# Patient Record
Sex: Male | Born: 1945 | Race: White | Hispanic: No | Marital: Married | Smoking: Never smoker
Health system: Southern US, Community
[De-identification: ages and names within clinical notes are randomized; demographics above are authoritative.]

---

## 2019-01-09 ENCOUNTER — Ambulatory Visit (HOSPITAL_COMMUNITY): Payer: Self-pay | Admitting: Psychiatry

## 2019-01-23 ENCOUNTER — Encounter (HOSPITAL_COMMUNITY): Payer: Self-pay

## 2019-01-23 ENCOUNTER — Emergency Department (HOSPITAL_COMMUNITY): Payer: Medicare HMO

## 2019-01-23 ENCOUNTER — Ambulatory Visit (HOSPITAL_COMMUNITY)
Admission: RE | Admit: 2019-01-23 | Discharge: 2019-01-23 | Disposition: A | Discharge status: Home / Self Care | Payer: Medicare HMO | Source: Home / Self Care | Attending: Psychiatry | Admitting: Psychiatry

## 2019-01-23 ENCOUNTER — Emergency Department (HOSPITAL_COMMUNITY)
Admission: EM | Admit: 2019-01-23 | Discharge: 2019-01-24 | Disposition: A | Discharge status: Other Acute Inpatient Hospital | Payer: Medicare HMO | Attending: Emergency Medicine | Admitting: Emergency Medicine

## 2019-01-23 ENCOUNTER — Other Ambulatory Visit: Payer: Self-pay

## 2019-01-23 DIAGNOSIS — R4587 Impulsiveness: Secondary | ICD-10-CM

## 2019-01-23 DIAGNOSIS — F322 Major depressive disorder, single episode, severe without psychotic features: Secondary | ICD-10-CM | POA: Diagnosis present

## 2019-01-23 DIAGNOSIS — R45851 Suicidal ideations: Secondary | ICD-10-CM | POA: Insufficient documentation

## 2019-01-23 DIAGNOSIS — F329 Major depressive disorder, single episode, unspecified: Secondary | ICD-10-CM

## 2019-01-23 DIAGNOSIS — Z79899 Other long term (current) drug therapy: Secondary | ICD-10-CM | POA: Insufficient documentation

## 2019-01-23 LAB — RAPID URINE DRUG SCREEN, HOSP PERFORMED
Amphetamines: NOT DETECTED
Barbiturates: NOT DETECTED
Benzodiazepines: NOT DETECTED
Cocaine: NOT DETECTED
OPIATES: NOT DETECTED
Tetrahydrocannabinol: NOT DETECTED

## 2019-01-23 LAB — URINALYSIS, ROUTINE W REFLEX MICROSCOPIC
Bilirubin Urine: NEGATIVE
Glucose, UA: NEGATIVE mg/dL
Hgb urine dipstick: NEGATIVE
Ketones, ur: NEGATIVE mg/dL
Leukocytes, UA: NEGATIVE
Nitrite: NEGATIVE
Protein, ur: NEGATIVE mg/dL
Specific Gravity, Urine: 1.004 — ABNORMAL LOW (ref 1.005–1.030)
pH: 8 (ref 5.0–8.0)

## 2019-01-23 LAB — COMPREHENSIVE METABOLIC PANEL
ALK PHOS: 79 U/L (ref 38–126)
ALT: 25 U/L (ref 0–44)
AST: 21 U/L (ref 15–41)
Albumin: 4.3 g/dL (ref 3.5–5.0)
Anion gap: 9 (ref 5–15)
BILIRUBIN TOTAL: 1.3 mg/dL — AB (ref 0.3–1.2)
BUN: 12 mg/dL (ref 8–23)
CALCIUM: 9.3 mg/dL (ref 8.9–10.3)
CO2: 28 mmol/L (ref 22–32)
Chloride: 93 mmol/L — ABNORMAL LOW (ref 98–111)
Creatinine, Ser: 0.76 mg/dL (ref 0.61–1.24)
GFR calc Af Amer: >60 mL/min (ref >60)
GFR calc non Af Amer: >60 mL/min (ref >60)
Glucose, Bld: 99 mg/dL (ref 70–99)
Potassium: 3.5 mmol/L (ref 3.5–5.1)
Sodium: 130 mmol/L — ABNORMAL LOW (ref 135–145)
TOTAL PROTEIN: 7.1 g/dL (ref 6.5–8.1)

## 2019-01-23 LAB — CBC
HCT: 48.2 % (ref 39.0–52.0)
Hemoglobin: 16.5 g/dL (ref 13.0–17.0)
MCH: 32.4 pg (ref 26.0–34.0)
MCHC: 34.2 g/dL (ref 30.0–36.0)
MCV: 94.5 fL (ref 80.0–100.0)
Platelets: 239 10*3/uL (ref 150–400)
RBC: 5.1 MIL/uL (ref 4.22–5.81)
RDW: 13.3 % (ref 11.5–15.5)
WBC: 8.4 10*3/uL (ref 4.0–10.5)
nRBC: 0 % (ref 0.0–0.2)

## 2019-01-23 LAB — ETHANOL: Alcohol, Ethyl (B): <10 mg/dL (ref <10)

## 2019-01-23 LAB — ACETAMINOPHEN LEVEL: Acetaminophen (Tylenol), Serum: <10 ug/mL — ABNORMAL LOW (ref 10–30)

## 2019-01-23 LAB — SALICYLATE LEVEL: Salicylate Lvl: <7 mg/dL (ref 2.8–30.0)

## 2019-01-23 MED ORDER — DULOXETINE HCL 30 MG PO CPEP
60.0000 mg | ORAL_CAPSULE | Freq: Every day | ORAL | Status: DC
  Administered 2019-01-24: 60 mg via ORAL
  Filled 2019-01-23: qty 2

## 2019-01-23 MED ORDER — TAMSULOSIN HCL 0.4 MG PO CAPS
0.4000 mg | ORAL_CAPSULE | Freq: Every day | ORAL | Status: DC
  Administered 2019-01-23 – 2019-01-24 (×2): 0.4 mg via ORAL
  Filled 2019-01-23 (×2): qty 1

## 2019-01-23 MED ORDER — ZOLPIDEM TARTRATE 10 MG PO TABS
10.0000 mg | ORAL_TABLET | Freq: Every evening | ORAL | Status: DC | PRN
  Administered 2019-01-23: 10 mg via ORAL
  Filled 2019-01-23: qty 1

## 2019-01-23 MED ORDER — HYDROCHLOROTHIAZIDE 12.5 MG PO CAPS
12.5000 mg | ORAL_CAPSULE | Freq: Every day | ORAL | Status: DC
  Administered 2019-01-24: 12.5 mg via ORAL
  Filled 2019-01-23: qty 1

## 2019-01-23 MED ORDER — PANTOPRAZOLE SODIUM 40 MG PO TBEC
40.0000 mg | DELAYED_RELEASE_TABLET | Freq: Every day | ORAL | Status: DC
  Administered 2019-01-23 – 2019-01-24 (×2): 40 mg via ORAL
  Filled 2019-01-23 (×2): qty 1

## 2019-01-23 MED ORDER — GABAPENTIN 300 MG PO CAPS
300.0000 mg | ORAL_CAPSULE | Freq: Three times a day (TID) | ORAL | Status: DC
  Administered 2019-01-23 – 2019-01-24 (×3): 300 mg via ORAL
  Filled 2019-01-23 (×3): qty 1

## 2019-01-23 MED ORDER — TIMOLOL MALEATE 0.5 % OP SOLN
1.0000 [drp] | Freq: Two times a day (BID) | OPHTHALMIC | Status: DC
  Administered 2019-01-23 – 2019-01-24 (×2): 1 [drp] via OPHTHALMIC
  Filled 2019-01-23: qty 5

## 2019-01-23 MED ORDER — LORAZEPAM 1 MG PO TABS
1.0000 mg | ORAL_TABLET | Freq: Once | ORAL | Status: AC
  Administered 2019-01-23: 1 mg via ORAL
  Filled 2019-01-23: qty 1

## 2019-01-23 MED ORDER — CLONAZEPAM 0.5 MG PO TABS
0.5000 mg | ORAL_TABLET | Freq: Every day | ORAL | Status: DC
  Administered 2019-01-23: 0.5 mg via ORAL
  Filled 2019-01-23: qty 1

## 2019-01-23 NOTE — ED Triage Notes (Signed)
Pt reports that he has been depressed and having thoughts of killing himself. Pt [place]. 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

## 2019-01-23 NOTE — ED Notes (Signed)
Bed: [facility]PT3 Expected date:  Expected time:  Means of arrival:  Comments: 

## 2019-01-23 NOTE — ED Notes (Signed)
Pt to room #40. Pt presents with sad affect, Pt reports increase in depression. Identifies primary stressor as developing tinnitus.  Expressing hopelessness, anhedonia , and difficulty sleeping. Identifies support system as wife. Encouragement and support provided. Special checks q 15 mins in place for safety, Video monitoring in place. Will continue to monitor.

## 2019-01-23 NOTE — ED Provider Notes (Addendum)
[facility] DEPT Provider Note   CSN: [id number] Arrival date & time: 01/23/19  1415     History   Chief Complaint Chief Complaint  Patient presents with  . Suicidal    HPI Ruben Duncan is a 73 y.o. male.  HPI Patient presents with depression and suicidal thoughts.  States he is been dealing with depression for years but has been worse recently.  States he got worse after diagnosed with tinnitus.  States that he was recently inpatient for 5 days up at [place].  Went to behavioral health today.  They had recommended inpatient treatment.  Denies substance abuse.  States he is on Cymbalta for the last month and states it is not been helping.  Plan to cut his wrists.  No attempt currently.  No hallucinations.  Patient states he just wants to get back to enjoying life.  Denies substance abuse. History reviewed. No pertinent past medical history.  There are no active problems to display for this patient.   History reviewed. No pertinent surgical history.      Home Medications    Prior to Admission medications   Medication Sig Start Date End Date Taking? Authorizing Provider  B Complex-C (B-COMPLEX WITH VITAMIN C) tablet Take 1 tablet by mouth daily.   Yes [provider]  Cholecalciferol (VITAMIN D3) 25 MCG (1000 UT) CAPS Take 1,000 mg by mouth daily.   Yes [provider]  clonazePAM (KLONOPIN) 0.5 MG tablet Take 0.5 mg by mouth at bedtime.  01/05/19  Yes [provider]  DULoxetine (CYMBALTA) 60 MG capsule Take 60 mg by mouth daily. 12/26/18  Yes [provider]  gabapentin (NEURONTIN) 300 MG capsule Take 300 mg by mouth 3 (three) times daily. 01/03/19  Yes [provider]  hydrochlorothiazide (MICROZIDE) 12.5 MG capsule Take 12.5 mg by mouth daily. 01/06/19  Yes [provider]  omeprazole (PRILOSEC) 20 MG capsule Take 20 mg by mouth daily.   Yes [provider]  tamsulosin (FLOMAX) 0.4  MG CAPS capsule Take 0.4 mg by mouth daily. 01/15/19  Yes [provider]  timolol (TIMOPTIC) 0.5 % ophthalmic solution Place 1 drop into both eyes 2 (two) times daily. 12/27/18  Yes [provider]  zolpidem (AMBIEN) 10 MG tablet Take 10 mg by mouth at bedtime as needed.  01/03/19  Yes [provider]    Family History History reviewed. No pertinent family history.  Social History Social History   Tobacco Use  . Smoking status: Not on file  Substance Use Topics  . Alcohol use: Not on file  . Drug use: Not on file     Allergies   Citalopram   Review of Systems Review of Systems  Constitutional: Positive for appetite change. Negative for unexpected weight change.  HENT: Negative for congestion.   Respiratory: Negative for shortness of breath.   Cardiovascular: Negative for chest pain.  Gastrointestinal: Negative for abdominal distention.  Genitourinary: Negative for flank pain.  Musculoskeletal: Negative for back pain.  Skin: Negative for rash.  Neurological: Negative for weakness.  Psychiatric/Behavioral: Positive for dysphoric mood and suicidal ideas. Negative for hallucinations. The patient is not hyperactive.      Physical Exam Updated Vital Signs BP (!) 159/106 (BP Location: Left Arm)   Pulse 62   Temp 97.9 F (36.6 C)   Resp 18   SpO2 99%   Physical Exam HENT:     Head: Atraumatic.     Mouth/Throat:  Mouth: Mucous membranes are moist.  Eyes:     Extraocular Movements: Extraocular movements intact.  Neck:     Musculoskeletal: Neck supple.  Cardiovascular:     Rate and Rhythm: Normal rate and regular rhythm.     Heart sounds: Normal heart sounds.  Pulmonary:     Effort: Pulmonary effort is normal.  Abdominal:     Tenderness: There is no abdominal tenderness.  Musculoskeletal:     Right lower leg: No edema.     Left lower leg: No edema.  Skin:    General: Skin is warm.     Capillary Refill: Capillary refill takes less  than 2 seconds.  Neurological:     General: No focal deficit present.     Mental Status: He is alert.  Psychiatric:     Comments: Patient has a depressed affect      ED Treatments / Results  Labs (all labs ordered are listed, but only abnormal results are displayed) Labs Reviewed  COMPREHENSIVE METABOLIC PANEL - Abnormal; Notable for the following components:      Result Value   Sodium 130 (*)    Chloride 93 (*)    Total Bilirubin 1.3 (*)    All other components within normal limits  ACETAMINOPHEN LEVEL - Abnormal; Notable for the following components:   Acetaminophen (Tylenol), Serum <10 (*)    All other components within normal limits  URINALYSIS, ROUTINE W REFLEX MICROSCOPIC - Abnormal; Notable for the following components:   Color, Urine STRAW (*)    Specific Gravity, Urine 1.004 (*)    All other components within normal limits  ETHANOL  SALICYLATE LEVEL  CBC  RAPID URINE DRUG SCREEN, HOSP PERFORMED    EKG None  Radiology Dg Chest 2 View  Result Date: 01/23/2019 CLINICAL DATA:  Depression EXAM: CHEST - 2 VIEW COMPARISON:  None. FINDINGS: Heart size is normal. Mediastinal shadows are normal. The lungs are clear. The vascularity is normal. No effusions. Ordinary degenerative changes affect the spine. IMPRESSION: No active cardiopulmonary disease. Electronically Signed   By: Nelson Chimes M.D.   On: 01/23/2019 16:04    Procedures Procedures (including critical care time)  Medications Ordered in ED Medications  clonazePAM (KLONOPIN) tablet 0.5 mg (has no administration in time range)  DULoxetine (CYMBALTA) DR capsule 60 mg (has no administration in time range)  gabapentin (NEURONTIN) capsule 300 mg (has no administration in time range)  hydrochlorothiazide (MICROZIDE) capsule 12.5 mg (has no administration in time range)  tamsulosin (FLOMAX) capsule 0.4 mg (has no administration in time range)  pantoprazole (PROTONIX) EC tablet 40 mg (has no administration in time  range)  timolol (TIMOPTIC) 0.5 % ophthalmic solution 1 drop (has no administration in time range)  zolpidem (AMBIEN) tablet 10 mg (has no administration in time range)  LORazepam (ATIVAN) tablet 1 mg (1 mg Oral Given 01/23/19 1740)     Initial Impression / Assessment and Plan / ED Course  I have reviewed the triage vital signs and the nursing notes.  Pertinent labs & imaging results that were available during my care of the patient were reviewed by me and considered in my medical decision making (see chart for details).     Patient presents with depression and suicidal ideation.  Somewhat based on his tenderness.  Has mild hyponatremia that will need to be followed.  Medically cleared.  Final Clinical Impressions(s) / ED Diagnoses   Final diagnoses:  Suicidal ideation    ED Discharge Orders    None  Davonna Belling, MD 01/23/19 1904  Accepted [facility].  Dr. Leonette [facility].  Reevaluated before transfer.    Davonna Belling, MD 01/24/19 1550

## 2019-01-23 NOTE — ED Notes (Signed)
Pt c/o anxiety MD made aware. Awaiting orders.

## 2019-01-23 NOTE — H&P (Signed)
Behavioral Health Medical Screening Exam  Ruben Duncan is an 73 y.o. male patient presents to [facility] as a walk in with complaints of suicidal ideation and plan to cut his wrist.  Patient is unable to contract for safety  Total Time spent with patient: 30 minutes  Psychiatric Specialty Exam: Physical Exam  Constitutional: He is oriented to person, place, and time. He appears well-developed and well-nourished.  Neck: Normal range of motion. Neck supple.  Respiratory: Effort normal.  Musculoskeletal: Normal range of motion.  Neurological: He is alert and oriented to person, place, and time.  Skin: Skin is warm and dry.  Psychiatric: His speech is normal and behavior is normal. Cognition and memory are normal. He expresses impulsivity. He exhibits a depressed mood. He expresses suicidal ideation. He expresses suicidal plans.    ROS  Blood pressure (!) 150/102, pulse (!) 53, temperature 98.5 F (36.9 C), resp. rate 18, SpO2 100 %.There is no height or weight on file to calculate BMI.  General Appearance: Casual  Eye Contact:  Minimal  Speech:  Clear and Coherent and Normal Rate  Volume:  Normal  Mood:  Depressed and Hopeless  Affect:  Depressed and Flat  Thought Process:  Coherent and Goal Directed  Orientation:  Full (Time, Place, and Person)  Thought Content:  Logical  Suicidal Thoughts:  Yes.  with intent/plan  Homicidal Thoughts:  No  Memory:  Immediate;   Good Recent;   Good Remote;   Good  Judgement:  Impaired  Insight:  Lacking  Psychomotor Activity:  Decreased  Concentration: Concentration: Fair and Attention Span: Fair  Recall:  Good  Fund of Knowledge:Good  Language: Good  Akathisia:  No  Handed:  Right  AIMS (if indicated):     Assets:  Communication Skills Desire for Improvement Financial Resources/Insurance Housing Resilience Social Support Transportation  Sleep:       Musculoskeletal: Strength & Muscle Tone: within normal limits Gait & Station:  normal Patient leans: N/A  Blood pressure (!) 150/102, pulse (!) 53, temperature 98.5 F (36.9 C), resp. rate 18, SpO2 100 %.  Recommendations:Inpatient psychiatric treatment  Based on my evaluation the patient does not appear to have an emergency medical condition.  Desmin Daleo, NP 01/23/2019, 1:36 PM

## 2019-01-23 NOTE — ED Notes (Signed)
Pleasant, quiet, flat affect. States he is able to contract for safety at this time but does endorse feeling anxious. He denies any HI or psychotic sx.His bed was moved by staff to the opposite wall because he doesn't believe he can sleep with so much light and moving the bed made it slightly darker as he is now not directly under the light. He did call his wife earlier this shift and had a brief conversation, when Probation officer commented on its brevity he said after 50 years there is not much to say. No complaints voiced. Will continue to monitor for safety.

## 2019-01-23 NOTE — BH Assessment (Signed)
Assessment Note  Ruben Duncan is an 73 y.o. male. Pt reports SI with a plan to cut his wrists. Pt denies HI/AVH. Pt states he has been diagnosed with Tinnitus. The Pt states since his diagnosis he feels hopeless and has been thinking of "ending it." Pt denies previous SI attempts. Pt states he has been diagnosed with depression all of his life but it has worsened. The Pt has been seen by Dr. Reece Levy. The Pt is currently prescribed Cymbalta and Neurontin. The Pt reports previous inpatient treatment in January 2020.   Shuvon, NP recommends inpatient and medical clearance.   Diagnosis:  F33.2 MDD  Past Medical History: No past medical history on file.   Family History: No family history on file.  Social History:  has no history on file for tobacco, alcohol, and drug.  Additional Social History:  Alcohol / Drug Use Pain Medications: please see mar Prescriptions: please see mar Over the Counter: please see mar History of alcohol / drug use?: No history of alcohol / drug abuse Longest period of sobriety (when/how long): NA  CIWA: CIWA-Ar BP: (!) 150/102 Pulse Rate: (!) 53 COWS:    Allergies: Allergies not on file  Home Medications: (Not in a hospital admission)   OB/GYN Status:  No LMP for male patient.  General Assessment Data Location of Assessment: [facility] Assessment Services TTS Assessment: In system Is this a Tele or Face-to-Face Assessment?: Face-to-Face Is this an Initial Assessment or a Re-assessment for this encounter?: Initial Assessment Patient Accompanied by:: N/A Language Other than English: No Living Arrangements: Other (Comment) What gender do you identify as?: Male Marital status: Single Maiden name: NA Pregnancy Status: No Living Arrangements: Spouse/significant other Can pt return to current living arrangement?: Yes Admission Status: Voluntary Is patient capable of signing voluntary admission?: Yes Referral Source: Self/Family/Friend Insurance type:  Materials engineer Exam ([place]) Medical Exam completed: Yes  Crisis Care Plan Living Arrangements: Spouse/significant other Legal Guardian: Other:(self) Name of Psychiatrist: NA Name of Therapist: NA  Education Status Is patient currently in school?: No Is the patient employed, unemployed or receiving disability?: Unemployed  Risk to self with the past 6 months Suicidal Ideation: Yes-Currently Present Has patient been a risk to self within the past 6 months prior to admission? : No Suicidal Intent: Yes-Currently Present Has patient had any suicidal intent within the past 6 months prior to admission? : No Is patient at risk for suicide?: Yes Suicidal Plan?: Yes-Currently Present Has patient had any suicidal plan within the past 6 months prior to admission? : No Specify Current Suicidal Plan: to cut his wrists Access to Means: Yes Specify Access to Suicidal Means: access to knives What has been your use of drugs/alcohol within the last 12 months?: NA Previous Attempts/Gestures: No How many times?: 0 Other Self Harm Risks: NA Triggers for Past Attempts: None known Intentional Self Injurious Behavior: None Family Suicide History: No Recent stressful life event(s): Other (Comment)(diagnosis) Persecutory voices/beliefs?: No Depression: Yes Depression Symptoms: Tearfulness, Isolating, Fatigue, Loss of interest in usual pleasures, Feeling worthless/self pity, Feeling angry/irritable Substance abuse history and/or treatment for substance abuse?: No Suicide prevention information given to non-admitted patients: Not applicable  Risk to Others within the past 6 months Homicidal Ideation: No Does patient have any lifetime risk of violence toward others beyond the six months prior to admission? : No Thoughts of Harm to Others: No Current Homicidal Intent: No Current Homicidal Plan: No Access to Homicidal Means: No Identified Victim: NA History  of harm to others?:  No Assessment of Violence: None Noted Violent Behavior Description: NA Does patient have access to weapons?: No Criminal Charges Pending?: No Does patient have a court date: No Is patient on probation?: No  Psychosis Hallucinations: None noted Delusions: None noted  Mental Status Report Appearance/Hygiene: Unremarkable Eye Contact: Fair Motor Activity: Freedom of movement Speech: Logical/coherent Level of Consciousness: Alert Mood: Depressed Affect: Depressed Anxiety Level: Minimal Thought Processes: Coherent, Relevant Judgement: Impaired Orientation: Person, Place, Time, Situation Obsessive Compulsive Thoughts/Behaviors: None  Cognitive Functioning Concentration: Normal Memory: Recent Intact, Remote Intact Is patient IDD: No Insight: Poor Impulse Control: Poor Appetite: Fair Have you had any weight changes? : No Change Sleep: No Change Total Hours of Sleep: 8 Vegetative Symptoms: None  ADLScreening [facility] Assessment Services) Patient's cognitive ability adequate to safely complete daily activities?: Yes Patient able to express need for assistance with ADLs?: Yes Independently performs ADLs?: Yes (appropriate for developmental age)  Prior Inpatient Therapy Prior Inpatient Therapy: Yes Prior Therapy Dates: Jan 2020 Prior Therapy Facilty/Provider(s): [facility] Reason for Treatment: depression  Prior Outpatient Therapy Prior Outpatient Therapy: Yes Prior Therapy Dates: current Prior Therapy Facilty/Provider(s): Dr. Reece Levy Reason for Treatment: depression Does patient have an ACCT team?: No Does patient have Intensive In-House Services?  : No Does patient have [facility] services? : No Does patient have P4CC services?: No  ADL Screening (condition at time of admission) Patient's cognitive ability adequate to safely complete daily activities?: Yes Is the patient deaf or have difficulty hearing?: No Does the patient have difficulty seeing, even when wearing  glasses/contacts?: No Does the patient have difficulty concentrating, remembering, or making decisions?: No Patient able to express need for assistance with ADLs?: Yes Does the patient have difficulty dressing or bathing?: No Independently performs ADLs?: Yes (appropriate for developmental age)       Abuse/Neglect Assessment (Assessment to be complete while patient is alone) Abuse/Neglect Assessment Can Be Completed: Yes Physical Abuse: Denies Verbal Abuse: Denies Sexual Abuse: Denies Exploitation of patient/patient's resources: Denies     Regulatory affairs officer (For Healthcare) Does Patient Have a Medical Advance Directive?: No Would patient like information on creating a medical advance directive?: No - Patient declined          Disposition:  Disposition Initial Assessment Completed for this Encounter: Yes Disposition of Patient: Admit Type of inpatient treatment program: Adult  On Site Evaluation by:   Reviewed with Physician:    Cyndia Bent 01/23/2019 2:07 PM

## 2019-01-23 NOTE — BHH Counselor (Signed)
Pt was assessed as a walk-in a [facility]. Shuvon, NP recommends inpatient treatment. TTS to seek placement. Updated disposition discussed with Dr. Alvino Chapel and Collie Siad, RN.    Vertell Novak, MS, [facility], [facility] Med Triage Specialist [phone]

## 2019-01-23 NOTE — ED Notes (Signed)
Specimen cup provided, pt encouraged to provide urine per MD order.

## 2019-01-24 ENCOUNTER — Other Ambulatory Visit: Payer: Self-pay

## 2019-01-24 ENCOUNTER — Other Ambulatory Visit: Payer: Self-pay | Admitting: Psychiatry

## 2019-01-24 ENCOUNTER — Inpatient Hospital Stay
Admission: AD | Admit: 2019-01-24 | Discharge: 2019-01-29 | DRG: 885 | Disposition: A | Discharge status: Home / Self Care | Payer: Medicare HMO | Source: Intra-hospital | Attending: Psychiatry | Admitting: Psychiatry

## 2019-01-24 DIAGNOSIS — G4700 Insomnia, unspecified: Secondary | ICD-10-CM | POA: Diagnosis present

## 2019-01-24 DIAGNOSIS — N400 Enlarged prostate without lower urinary tract symptoms: Secondary | ICD-10-CM | POA: Diagnosis present

## 2019-01-24 DIAGNOSIS — F1021 Alcohol dependence, in remission: Secondary | ICD-10-CM | POA: Diagnosis present

## 2019-01-24 DIAGNOSIS — H409 Unspecified glaucoma: Secondary | ICD-10-CM | POA: Diagnosis present

## 2019-01-24 DIAGNOSIS — K219 Gastro-esophageal reflux disease without esophagitis: Secondary | ICD-10-CM | POA: Diagnosis present

## 2019-01-24 DIAGNOSIS — F332 Major depressive disorder, recurrent severe without psychotic features: Principal | ICD-10-CM | POA: Diagnosis present

## 2019-01-24 DIAGNOSIS — F411 Generalized anxiety disorder: Secondary | ICD-10-CM | POA: Diagnosis present

## 2019-01-24 DIAGNOSIS — I10 Essential (primary) hypertension: Secondary | ICD-10-CM | POA: Diagnosis present

## 2019-01-24 DIAGNOSIS — D3610 Benign neoplasm of peripheral nerves and autonomic nervous system, unspecified: Secondary | ICD-10-CM | POA: Diagnosis present

## 2019-01-24 DIAGNOSIS — Z79899 Other long term (current) drug therapy: Secondary | ICD-10-CM

## 2019-01-24 DIAGNOSIS — R45851 Suicidal ideations: Secondary | ICD-10-CM | POA: Diagnosis present

## 2019-01-24 DIAGNOSIS — H9319 Tinnitus, unspecified ear: Secondary | ICD-10-CM

## 2019-01-24 DIAGNOSIS — H9311 Tinnitus, right ear: Secondary | ICD-10-CM | POA: Diagnosis present

## 2019-01-24 DIAGNOSIS — Z888 Allergy status to other drugs, medicaments and biological substances status: Secondary | ICD-10-CM

## 2019-01-24 DIAGNOSIS — F410 Panic disorder [episodic paroxysmal anxiety] without agoraphobia: Secondary | ICD-10-CM | POA: Diagnosis present

## 2019-01-24 DIAGNOSIS — F322 Major depressive disorder, single episode, severe without psychotic features: Secondary | ICD-10-CM | POA: Diagnosis present

## 2019-01-24 DIAGNOSIS — F329 Major depressive disorder, single episode, unspecified: Secondary | ICD-10-CM

## 2019-01-24 DIAGNOSIS — H9313 Tinnitus, bilateral: Secondary | ICD-10-CM | POA: Diagnosis not present

## 2019-01-24 MED ORDER — TIMOLOL MALEATE 0.5 % OP SOLN
1.0000 [drp] | Freq: Two times a day (BID) | OPHTHALMIC | Status: DC
  Administered 2019-01-24 – 2019-01-29 (×10): 1 [drp] via OPHTHALMIC
  Filled 2019-01-24: qty 5

## 2019-01-24 MED ORDER — ACETAMINOPHEN 325 MG PO TABS
650.0000 mg | ORAL_TABLET | Freq: Four times a day (QID) | ORAL | Status: DC | PRN
  Administered 2019-01-26: 650 mg via ORAL
  Filled 2019-01-24: qty 2

## 2019-01-24 MED ORDER — CLONAZEPAM 0.5 MG PO TABS
0.5000 mg | ORAL_TABLET | Freq: Every day | ORAL | Status: DC
  Administered 2019-01-24: 0.5 mg via ORAL
  Filled 2019-01-24: qty 1

## 2019-01-24 MED ORDER — GABAPENTIN 300 MG PO CAPS
300.0000 mg | ORAL_CAPSULE | Freq: Three times a day (TID) | ORAL | Status: DC
  Administered 2019-01-24 – 2019-01-29 (×15): 300 mg via ORAL
  Filled 2019-01-24 (×16): qty 1

## 2019-01-24 MED ORDER — HYDROXYZINE HCL 25 MG PO TABS
25.0000 mg | ORAL_TABLET | Freq: Three times a day (TID) | ORAL | Status: DC
  Administered 2019-01-24 – 2019-01-25 (×4): 25 mg via ORAL
  Filled 2019-01-24 (×5): qty 1

## 2019-01-24 MED ORDER — DULOXETINE HCL 30 MG PO CPEP
60.0000 mg | ORAL_CAPSULE | Freq: Every day | ORAL | Status: DC
  Administered 2019-01-25 – 2019-01-29 (×5): 60 mg via ORAL
  Filled 2019-01-24 (×5): qty 2

## 2019-01-24 MED ORDER — HYDROXYZINE HCL 25 MG PO TABS
25.0000 mg | ORAL_TABLET | Freq: Three times a day (TID) | ORAL | Status: DC
  Administered 2019-01-24 (×2): 25 mg via ORAL
  Filled 2019-01-24 (×2): qty 1

## 2019-01-24 MED ORDER — PANTOPRAZOLE SODIUM 40 MG PO TBEC
40.0000 mg | DELAYED_RELEASE_TABLET | Freq: Every day | ORAL | Status: DC
  Administered 2019-01-25 – 2019-01-29 (×5): 40 mg via ORAL
  Filled 2019-01-24 (×5): qty 1

## 2019-01-24 MED ORDER — MAGNESIUM HYDROXIDE 400 MG/5ML PO SUSP: 30.0000 mL | Freq: Every day | ORAL | Status: DC | PRN

## 2019-01-24 MED ORDER — HYDROCHLOROTHIAZIDE 12.5 MG PO CAPS
12.5000 mg | ORAL_CAPSULE | Freq: Every day | ORAL | Status: DC
  Administered 2019-01-25 – 2019-01-29 (×5): 12.5 mg via ORAL
  Filled 2019-01-24 (×5): qty 1

## 2019-01-24 MED ORDER — ZOLPIDEM TARTRATE 5 MG PO TABS
5.0000 mg | ORAL_TABLET | Freq: Every evening | ORAL | Status: DC | PRN
  Administered 2019-01-24: 5 mg via ORAL
  Filled 2019-01-24: qty 1

## 2019-01-24 MED ORDER — TAMSULOSIN HCL 0.4 MG PO CAPS
0.4000 mg | ORAL_CAPSULE | Freq: Every day | ORAL | Status: DC
  Administered 2019-01-25 – 2019-01-29 (×5): 0.4 mg via ORAL
  Filled 2019-01-24 (×5): qty 1

## 2019-01-24 MED ORDER — HYDROXYZINE HCL 25 MG PO TABS
50.0000 mg | ORAL_TABLET | Freq: Once | ORAL | Status: AC
  Administered 2019-01-24: 50 mg via ORAL
  Filled 2019-01-24: qty 2

## 2019-01-24 MED ORDER — ALUM & MAG HYDROXIDE-SIMETH 200-200-20 MG/5ML PO SUSP: 30.0000 mL | ORAL | Status: DC | PRN

## 2019-01-24 NOTE — Tx Team (Signed)
Initial Treatment Plan 01/24/2019 6:34 PM Ruben Duncan EHO:[id number]    PATIENT STRESSORS: Health problems Traumatic event   PATIENT STRENGTHS: Ability for insight Average or above average intelligence Communication skills Financial means Supportive family/friends   PATIENT IDENTIFIED PROBLEMS: Depression 01/24/19  Suicidal ideations 01/24/19                   DISCHARGE CRITERIA:  Ability to meet basic life and health needs Medical problems require only outpatient monitoring Verbal commitment to aftercare and medication compliance  PRELIMINARY DISCHARGE PLAN: Attend aftercare/continuing care group Return to previous living arrangement  PATIENT/FAMILY INVOLVEMENT: This treatment plan has been presented to and reviewed with the patient, Ruben Duncan, and/or family member, .  The patient and family have been given the opportunity to ask questions and make suggestions.  Merlene Morse, RN 01/24/2019, 6:34 PM

## 2019-01-24 NOTE — Consult Note (Signed)
[facility] Consult   Reason for Consult:  Suicidal Ideation Referring Physician:  EDP Patient Identification: Ruben Duncan MRN:  [id number] Principal Diagnosis: Major depressive disorder, single episode, severe without psychotic features ([place]) Diagnosis:  Principal Problem:   Major depressive disorder, single episode, severe without psychotic features ([place]) Active Problems:   Suicidal ideation   Total Time spent with patient: 30 minutes  Subjective:   Ruben Duncan is a 73 y.o. male patient admitted with suicidal ideation due to depression.  HPI:  Pt was seen and chart reviewed with treatment team and Dr Dwyane Dee. Pt lives with his wife in [place], [place] and was recently hospitalized for five days there for his depression. Pt stated his depression intensified after he was diagnosed with tinnitus. He stated he went to an ENT and they said there is nothing they can do to correct tinnutis. Pt appears sad and has a flat affect. Pt is unable to contract for safety outside the hospital. Pt's UDS/BAL are negative. He has been on Cymbalta for the past month but feels it is not helping. Pt has a plan to cut his wrists but has not attempted this. He stated he has no enjoyment in his life and just wants to get his mental health right so he can get back to living. Pt's home medications were restarted. Pt is calm and cooperative, alert & oriented x 4 and is able to take of his ADL's. He does not use an assistive device to walk. Pt is pleasant on approach. He has no pertinent medical history. Pt is recommended for an inpatient psychiatric admission for stabilization and medication management.   Past Psychiatric History: As above  Risk to Self:  Yes Risk to Others:  No Prior Inpatient Therapy:  Yes, in [place] Prior Outpatient Therapy:    Past Medical History: History reviewed. No pertinent past medical history. History reviewed. No pertinent surgical history. Family History: History  reviewed. No pertinent family history. Family Psychiatric  History: Pt did not give this information Social History:  Social History   Substance and Sexual Activity  Alcohol Use Not on file     Social History   Substance and Sexual Activity  Drug Use Not on file    Social History   Socioeconomic History  . Marital status: Married    Spouse name: Not on file  . Number of children: Not on file  . Years of education: Not on file  . Highest education level: Not on file  Occupational History  . Not on file  Social Needs  . Financial resource strain: Not on file  . Food insecurity:    Worry: Not on file    Inability: Not on file  . Transportation needs:    Medical: Not on file    Non-medical: Not on file  Tobacco Use  . Smoking status: Not on file  Substance and Sexual Activity  . Alcohol use: Not on file  . Drug use: Not on file  . Sexual activity: Not on file  Lifestyle  . Physical activity:    Days per week: Not on file    Minutes per session: Not on file  . Stress: Not on file  Relationships  . Social connections:    Talks on phone: Not on file    Gets together: Not on file    Attends religious service: Not on file    Active member of club or organization: Not on file    Attends meetings of clubs or  organizations: Not on file    Relationship status: Not on file  Other Topics Concern  . Not on file  Social History Narrative  . Not on file   Additional Social History:    Allergies:   Allergies  Allergen Reactions  . Citalopram     Ringing in the ears and anxiety    Labs:  Results for orders placed or performed during the hospital encounter of 01/23/19 (from the past 48 hour(s))  Comprehensive metabolic panel     Status: Abnormal   Collection Time: 01/23/19  3:08 PM  Result Value Ref Range   Sodium 130 (L) 135 - 145 mmol/L   Potassium 3.5 3.5 - 5.1 mmol/L   Chloride 93 (L) 98 - 111 mmol/L   CO2 28 22 - 32 mmol/L   Glucose, Bld 99 70 - 99 mg/dL    BUN 12 8 - 23 mg/dL   Creatinine, Ser 0.76 0.61 - 1.24 mg/dL   Calcium 9.3 8.9 - 10.3 mg/dL   Total Protein 7.1 6.5 - 8.1 g/dL   Albumin 4.3 3.5 - 5.0 g/dL   AST 21 15 - 41 U/L   ALT 25 0 - 44 U/L   Alkaline Phosphatase 79 38 - 126 U/L   Total Bilirubin 1.3 (H) 0.3 - 1.2 mg/dL   GFR calc non Af Amer >60 >60 mL/min   GFR calc Af Amer >60 >60 mL/min   Anion gap 9 5 - 15    Comment: Performed at [facility], [place] [address] [place], [place] [address]  Ethanol     Status: None   Collection Time: 01/23/19  3:08 PM  Result Value Ref Range   Alcohol, Ethyl (B) <10 <10 mg/dL    Comment: (NOTE) Lowest detectable limit for serum alcohol is 10 mg/dL. For medical purposes only. Performed at [facility], [place] [address], [place], [place] [address]   Salicylate level     Status: None   Collection Time: 01/23/19  3:08 PM  Result Value Ref Range   Salicylate Lvl <2.9 2.8 - 30.0 mg/dL    Comment: Performed at [facility], [place] [address], [place], [place] [address]  Acetaminophen level     Status: Abnormal   Collection Time: 01/23/19  3:08 PM  Result Value Ref Range   Acetaminophen (Tylenol), Serum <10 (L) 10 - 30 ug/mL    Comment: (NOTE) Therapeutic concentrations vary significantly. A range of 10-30 ug/mL  may be an effective concentration for many patients. However, some  are best treated at concentrations outside of this range. Acetaminophen concentrations >150 ug/mL at 4 hours after ingestion  and >50 ug/mL at 12 hours after ingestion are often associated with  toxic reactions. Performed at [facility], [place] [address] [place], [place] [address]   cbc     Status: None   Collection Time: 01/23/19  3:08 PM  Result Value Ref Range   WBC 8.4 4.0 - 10.5 K/uL   RBC 5.10 4.22 - 5.81 MIL/uL   Hemoglobin 16.5 13.0 - 17.0 g/dL   HCT 48.2 39.0 - 52.0 %   MCV 94.5 80.0 - 100.0 fL   MCH 32.4 26.0 - 34.0 pg   MCHC  34.2 30.0 - 36.0 g/dL   RDW 13.3 11.5 - 15.5 %   Platelets 239 150 - 400 K/uL   nRBC 0.0 0.0 - 0.2 %    Comment: Performed at [facility], [place] [address]  [place], [place] [address]  Rapid urine drug screen (hospital performed)     Status: None   Collection Time: 01/23/19  5:23 PM  Result Value Ref Range   Opiates NONE DETECTED NONE DETECTED   Cocaine NONE DETECTED NONE DETECTED   Benzodiazepines NONE DETECTED NONE DETECTED   Amphetamines NONE DETECTED NONE DETECTED   Tetrahydrocannabinol NONE DETECTED NONE DETECTED   Barbiturates NONE DETECTED NONE DETECTED    Comment: (NOTE) DRUG SCREEN FOR MEDICAL PURPOSES ONLY.  IF CONFIRMATION IS NEEDED FOR ANY PURPOSE, NOTIFY LAB WITHIN 5 DAYS. LOWEST DETECTABLE LIMITS FOR URINE DRUG SCREEN Drug Class                     Cutoff (ng/mL) Amphetamine and metabolites    1000 Barbiturate and metabolites    200 Benzodiazepine                 616 Tricyclics and metabolites     300 Opiates and metabolites        300 Cocaine and metabolites        300 THC                            50 Performed at [facility], [place] [address], [place], [place] [address]   Urinalysis, Routine w reflex microscopic     Status: Abnormal   Collection Time: 01/23/19  5:42 PM  Result Value Ref Range   Color, Urine STRAW (A) YELLOW   APPearance CLEAR CLEAR   Specific Gravity, Urine 1.004 (L) 1.005 - 1.030   pH 8.0 5.0 - 8.0   Glucose, UA NEGATIVE NEGATIVE mg/dL   Hgb urine dipstick NEGATIVE NEGATIVE   Bilirubin Urine NEGATIVE NEGATIVE   Ketones, ur NEGATIVE NEGATIVE mg/dL   Protein, ur NEGATIVE NEGATIVE mg/dL   Nitrite NEGATIVE NEGATIVE   Leukocytes, UA NEGATIVE NEGATIVE    Comment: Performed at [place] [address], [place], [place] [address]    Current Facility-Administered Medications  Medication Dose Route Frequency Provider Last Rate Last Dose  . clonazePAM (KLONOPIN) tablet 0.5 mg  0.5 mg  Oral Lowanda Foster, MD   0.5 mg at 01/23/19 2113  . DULoxetine (CYMBALTA) DR capsule 60 mg  60 mg Oral Daily Davonna Belling, MD   60 mg at 01/24/19 0949  . gabapentin (NEURONTIN) capsule 300 mg  300 mg Oral TID Davonna Belling, MD   300 mg at 01/24/19 0949  . hydrochlorothiazide (MICROZIDE) capsule 12.5 mg  12.5 mg Oral Daily Davonna Belling, MD   12.5 mg at 01/24/19 0950  . hydrOXYzine (ATARAX/VISTARIL) tablet 25 mg  25 mg Oral TID Ethelene Hal, NP   25 mg at 01/24/19 1322  . pantoprazole (PROTONIX) EC tablet 40 mg  40 mg Oral Daily Davonna Belling, MD   40 mg at 01/24/19 0949  . tamsulosin (FLOMAX) capsule 0.4 mg  0.4 mg Oral Daily Davonna Belling, MD   0.4 mg at 01/24/19 0949  . timolol (TIMOPTIC) 0.5 % ophthalmic solution 1 drop  1 drop Both Eyes BID Davonna Belling, MD   1 drop at 01/24/19 0949  . zolpidem (AMBIEN) tablet 10 mg  10 mg Oral QHS PRN Davonna Belling, MD   10 mg at 01/23/19 2201   Current Outpatient Medications  Medication Sig Dispense Refill  . B Complex-C (B-COMPLEX WITH VITAMIN C) tablet Take 1 tablet by mouth daily.    . Cholecalciferol (  VITAMIN D3) 25 MCG (1000 UT) CAPS Take 1,000 mg by mouth daily.    . clonazePAM (KLONOPIN) 0.5 MG tablet Take 0.5 mg by mouth at bedtime.     . DULoxetine (CYMBALTA) 60 MG capsule Take 60 mg by mouth daily.    Marland Kitchen gabapentin (NEURONTIN) 300 MG capsule Take 300 mg by mouth 3 (three) times daily.    . hydrochlorothiazide (MICROZIDE) 12.5 MG capsule Take 12.5 mg by mouth daily.    Marland Kitchen omeprazole (PRILOSEC) 20 MG capsule Take 20 mg by mouth daily.    . tamsulosin (FLOMAX) 0.4 MG CAPS capsule Take 0.4 mg by mouth daily.    . timolol (TIMOPTIC) 0.5 % ophthalmic solution Place 1 drop into both eyes 2 (two) times daily.    Marland Kitchen zolpidem (AMBIEN) 10 MG tablet Take 10 mg by mouth at bedtime as needed.       Musculoskeletal: Strength & Muscle Tone: within normal limits Gait & Station: normal Patient leans:  N/A  Psychiatric Specialty Exam: Physical Exam  ROS  Blood pressure 137/90, pulse 72, temperature 98.2 F (36.8 C), temperature source Oral, resp. rate 16, SpO2 98 %.There is no height or weight on file to calculate BMI.  General Appearance: Casual  Eye Contact:  Good  Speech:  Clear and Coherent and Normal Rate  Volume:  Decreased  Mood:  Depressed and Dysphoric  Affect:  Congruent, Depressed and Flat  Thought Process:  Coherent, Linear and Descriptions of Associations: Intact  Orientation:  Full (Time, Place, and Person)  Thought Content:  Logical  Suicidal Thoughts:  Yes.  without intent/plan  Homicidal Thoughts:  No  Memory:  Immediate;   Good Recent;   Good Remote;   Fair  Judgement:  Fair  Insight:  Present  Psychomotor Activity:  Normal  Concentration:  Concentration: Good and Attention Span: Fair  Recall:  Good  Fund of Knowledge:  Good  Language:  Good  Akathisia:  No  Handed:  Right  AIMS (if indicated):     Assets:  Communication Skills Desire for Improvement Financial Resources/Insurance Housing Social Support Transportation  ADL's:  Intact  Cognition:  WNL  Sleep:        Treatment Plan Summary: Daily contact with patient to assess and evaluate symptoms and progress in treatment and Medication management   Crisis Stabilization  Medication Management: Continue Cymbalta 60 mg daily for depression Continue Gabapentin 300 mg TID for anxiety Continue Klonopin 0.5 mg at bedtime for sleep   Disposition: Recommend psychiatric Inpatient admission when medically cleared. TTS to seek placement  Ethelene Hal, NP 01/24/2019 2:11 PM

## 2019-01-24 NOTE — Consult Note (Signed)
   regional psychiatry: Patient was referred for consideration of admission to our inpatient unit.  Chart reviewed.  Case reviewed with TTS.  Patient appears to be appropriate for admission.  Discharge readmit orders will be placed.

## 2019-01-24 NOTE — BH Assessment (Addendum)
[facility] Assessment Progress Note  Per Hampton Abbot, MD, this pt requires psychiatric hospitalization.  Ian Malkin, Counselor reports that pt has been accepted to [facility] by Dr Weber Cooks to Rm 309.  Pt has signed Voluntary Admission and Consent for Treatment, as well as Consent to Release Information to his son and his daughter, as well as his providers at [facility] in [place], [place], and a notification call has been placed to the providers.  Signed forms have been faxed to [phone].  Pt's nurse, Nena Jordan, has been notified, and agrees to call report to [phone].  Pt is to be transported via Stacey Drain, [place] Coordinator [phone]   Addendum:  At 15:49 Kerry Dory calls, leaving a voice message for this writer, indicating that he has received signed consent forms.  Jalene Mullet, [place] Coordinator [phone]

## 2019-01-24 NOTE — BH Assessment (Signed)
Patient has been accepted to [facility].  Accepting physician is Dr. Weber Cooks.  Attending Physician will be Dr. Weber Cooks.  Patient has been assigned to room 309, by [place].  Call report to [phone].  Representative/Transfer Coordinator is Requan Hardge Patient pre-admitted by [facility] Patient Access Elberta Fortis)  [facility] ER Staff Marcello Moores, TTS) made aware of acceptance.

## 2019-01-24 NOTE — Progress Notes (Signed)
Patient looks sad and depressed but cooperative during admission assessment. Patient verbalized suicidal thoughtI at this time,contracts for safety. Patient denies HI and AVH. Patient informed of fall risk status, fall risk assessed "moderate" at this time. Patient oriented to unit/staff/room. Patient denies any questions/concerns at this time. Patient safe on unit with Q15 minute checks for safety. Skin assessment done,no contraband found.

## 2019-01-25 ENCOUNTER — Inpatient Hospital Stay: Payer: Medicare HMO

## 2019-01-25 ENCOUNTER — Other Ambulatory Visit: Payer: Self-pay

## 2019-01-25 DIAGNOSIS — K219 Gastro-esophageal reflux disease without esophagitis: Secondary | ICD-10-CM

## 2019-01-25 DIAGNOSIS — H9311 Tinnitus, right ear: Secondary | ICD-10-CM

## 2019-01-25 DIAGNOSIS — F411 Generalized anxiety disorder: Secondary | ICD-10-CM

## 2019-01-25 DIAGNOSIS — H9319 Tinnitus, unspecified ear: Secondary | ICD-10-CM

## 2019-01-25 DIAGNOSIS — F332 Major depressive disorder, recurrent severe without psychotic features: Principal | ICD-10-CM

## 2019-01-25 MED ORDER — HYDROXYZINE HCL 25 MG PO TABS
25.0000 mg | ORAL_TABLET | Freq: Three times a day (TID) | ORAL | Status: DC | PRN
  Administered 2019-01-26 – 2019-01-29 (×4): 25 mg via ORAL
  Filled 2019-01-25 (×3): qty 1

## 2019-01-25 MED ORDER — CLONAZEPAM 0.5 MG PO TABS
0.2500 mg | ORAL_TABLET | Freq: Every day | ORAL | Status: DC
  Administered 2019-01-25 – 2019-01-28 (×4): 0.25 mg via ORAL
  Filled 2019-01-25 (×4): qty 1

## 2019-01-25 MED ORDER — GADOBUTROL 1 MMOL/ML IV SOLN
8.0000 mL | Freq: Once | INTRAVENOUS | Status: AC | PRN
  Administered 2019-01-25: 8 mL via INTRAVENOUS

## 2019-01-25 MED ORDER — TRAZODONE HCL 100 MG PO TABS
100.0000 mg | ORAL_TABLET | Freq: Every day | ORAL | Status: DC
  Administered 2019-01-25: 100 mg via ORAL
  Filled 2019-01-25: qty 1

## 2019-01-25 MED ORDER — ARIPIPRAZOLE 5 MG PO TABS
5.0000 mg | ORAL_TABLET | Freq: Every day | ORAL | Status: DC
  Administered 2019-01-25 – 2019-01-29 (×5): 5 mg via ORAL
  Filled 2019-01-25 (×5): qty 1

## 2019-01-25 NOTE — Consult Note (Signed)
NEUROLOGY CONSULT  Reason for Consult: ? L schwannoma Referring Physician: Dr. Nicolasa Duncan     CC: R Tinnitus  HPI: Ruben Duncan is an 73 y.o. male with past medical history significant for recurrent major depression disorder and anxiety who was admitted to the behavioral health unit on January 25, 2023 suicidal thoughts and planning to cut his wrist.  Patient also was complaining about right-sided tinnitus that started in January, he said that that the symptoms has bothering him a lot and he thought initially that is due to Celexa, he said that the tinnitus is severe at night and it is causing him to be more depressed.  He was seen by ENT without any help.  MRI of the brain with and without contrast was done and it shows 10 x 12 mm mass at the level and below the jugular foramen most likely a schwannoma.  Neurology consult was called for this findings. Patient denied any fever, he denied any shortness of breath, he denied any chest pain, he denied any focal motor weakness, he denied any hearing loss, no visual changes.  Past Medical History History reviewed. No pertinent past medical history.  Past Surgical History History reviewed. No pertinent surgical history.  Family History History reviewed. No pertinent family history.  Social History    reports that he has never smoked. He has never used smokeless tobacco. No history on file for alcohol and drug.  Allergies Allergies  Allergen Reactions  . Citalopram     Ringing in the ears and anxiety    Home Medications Medications Prior to Admission  Medication Sig Dispense Refill  . B Complex-C (B-COMPLEX WITH VITAMIN C) tablet Take 1 tablet by mouth daily.    . Cholecalciferol (VITAMIN D3) 25 MCG (1000 UT) CAPS Take 1,000 mg by mouth daily.    . clonazePAM (KLONOPIN) 0.5 MG tablet Take 0.5 mg by mouth at bedtime.     . DULoxetine (CYMBALTA) 60 MG capsule Take 60 mg by mouth daily.    Marland Kitchen gabapentin (NEURONTIN) 300 MG capsule Take 300 mg by  mouth 3 (three) times daily.    . hydrochlorothiazide (MICROZIDE) 12.5 MG capsule Take 12.5 mg by mouth daily.    Marland Kitchen omeprazole (PRILOSEC) 20 MG capsule Take 20 mg by mouth daily.    . tamsulosin (FLOMAX) 0.4 MG CAPS capsule Take 0.4 mg by mouth daily.    . timolol (TIMOPTIC) 0.5 % ophthalmic solution Place 1 drop into both eyes 2 (two) times daily.    Marland Kitchen zolpidem (AMBIEN) 10 MG tablet Take 10 mg by mouth at bedtime as needed.       Hospital Medications . ARIPiprazole  5 mg Oral Daily  . clonazePAM  0.25 mg Oral QPC supper  . DULoxetine  60 mg Oral Daily  . gabapentin  300 mg Oral TID  . hydrochlorothiazide  12.5 mg Oral Daily  . hydrOXYzine  25 mg Oral TID  . pantoprazole  40 mg Oral Daily  . tamsulosin  0.4 mg Oral Daily  . timolol  1 drop Both Eyes BID  . traZODone  100 mg Oral QHS     ROS: Negative except for what is reported in the HPI  Physical Examination: Vitals:   01/24/19 1747 01/25/19 0603  BP: 115/77 116/77  Pulse: 68 70  Resp: 18 18  Temp: 98.3 F (36.8 C) 97.8 F (36.6 C)  TempSrc: Oral Oral  SpO2: 99% 96%  Weight: 89.4 kg   Height: 5\' 9"  (1.753 m)  Neurologic Examination:   Mental Status:  Alert, oriented to self, Speech without evidence of dysarthria or aphasia. Able to follow 3 step commands without difficulty.  Cranial Nerves:  bilateral visual fields intact Pupils were equal and reacted. Extraocular movements were full.  -no facial numbness and no facial weakness.  hearing normal.  normal speech and symmetrical palatal movement.  midline tongue extension  Motor: 5/5 all groups  Sensory: Intact to light touch in all extremities. Cerebellar: Normal finger to nose and heel to shin bilaterally. Gait: normal    LABORATORY STUDIES:  Basic Metabolic Panel: Recent Labs  Lab 01/23/19 1508  NA 130*  K 3.5  CL 93*  CO2 28  GLUCOSE 99  BUN 12  CREATININE 0.76  CALCIUM 9.3    Liver Function Tests: Recent Labs  Lab 01/23/19 1508   AST 21  ALT 25  ALKPHOS 79  BILITOT 1.3*  PROT 7.1  ALBUMIN 4.3   No results for input(s): LIPASE, AMYLASE in the last 168 hours. No results for input(s): AMMONIA in the last 168 hours.  CBC: Recent Labs  Lab 01/23/19 1508  WBC 8.4  HGB 16.5  HCT 48.2  MCV 94.5  PLT 239    Cardiac Enzymes: No results for input(s): CKTOTAL, CKMB, CKMBINDEX, TROPONINI in the last 168 hours.  BNP: Invalid input(s): POCBNP  CBG: No results for input(s): GLUCAP in the last 168 hours.  Microbiology:   Coagulation Studies: No results for input(s): LABPROT, INR in the last 72 hours.  Urinalysis:  Recent Labs  Lab 01/23/19 1742  COLORURINE STRAW*  LABSPEC 1.004*  PHURINE 8.0  GLUCOSEU NEGATIVE  HGBUR NEGATIVE  BILIRUBINUR NEGATIVE  KETONESUR NEGATIVE  PROTEINUR NEGATIVE  NITRITE NEGATIVE  LEUKOCYTESUR NEGATIVE    Lipid Panel:  No results found for: CHOL, TRIG, HDL, CHOLHDL, VLDL, LDLCALC  HgbA1C:  No results found for: HGBA1C  Urine Drug Screen:      Component Value Date/Time   LABOPIA NONE DETECTED 01/23/2019 1723   COCAINSCRNUR NONE DETECTED 01/23/2019 1723   LABBENZ NONE DETECTED 01/23/2019 1723   AMPHETMU NONE DETECTED 01/23/2019 1723   THCU NONE DETECTED 01/23/2019 1723   LABBARB NONE DETECTED 01/23/2019 1723     Alcohol Level:  Recent Labs  Lab 01/23/19 1508  ETH <10    Miscellaneous labs:  EKG  EKG  IMAGING: Dg Chest 2 View  Result Date: 01/23/2019 CLINICAL DATA:  Depression EXAM: CHEST - 2 VIEW COMPARISON:  None. FINDINGS: Heart size is normal. Mediastinal shadows are normal. The lungs are clear. The vascularity is normal. No effusions. Ordinary degenerative changes affect the spine. IMPRESSION: No active cardiopulmonary disease. Electronically Signed   By: Nelson Chimes M.D.   On: 01/23/2019 16:04   Mr [name] AC Contrast  Result Date: 01/25/2019 CLINICAL DATA:  Tinnitus that is not resolving, non pulsatile. EXAM: MRI HEAD WITHOUT AND WITH  CONTRAST TECHNIQUE: Multiplanar, multiecho pulse sequences of the brain and surrounding structures were obtained without and with intravenous contrast. CONTRAST:  8 cc Gadavist intravenous COMPARISON:  None. FINDINGS: Brain: Symmetric normal labyrinthine signal. No mastoid or middle ear opacification. Mild cerebellar atrophy. Unremarkable supratentorial brain for age. No infarct, hemorrhage, hydrocephalus, or mass. At and below the left jugular foramen is a 10 x 12 mm fusiform enhancing mass that displaces and partially effaces the internal jugular vein posteriorly. Fairly homogeneous T2 signal with no apparent internal flow voids. Vascular: Lost flow void in the upper left jugular vein, which has also diffusion hyperintense  but symmetrically enhancing on postcontrast imaging. Patient's tinnitus is non pulsatile. Skull and upper cervical spine: Negative for marrow lesion. Left cervical facet spurring where covered. Sinuses/Orbits: Negative.  No mastoid or middle ear opacification IMPRESSION: 1. 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 Recommend imaging follow-up. CT of the skull base may also be useful in evaluating for erosion versus scalloping. 2. There is mass effect on the internal jugular vein which could be a cause of tinnitus. Electronically Signed   By: Monte Fantasia M.D.   On: 01/25/2019 12:58     Assessment/Plan:   73 years old gentleman who was admitted to the behavioral unit due to recurrent major depression disorders and anxiety and suicidal thoughts who has been complaining about right ear tinnitus for the last couple of weeks with MRI of the brain with and without contrast showing 10 x 12 mm mass at the level of the jugular foramen consistent with schwannoma.  Patient needs evaluation by neurosurgery/Oncology to determine the best treatment for his schwannoma.  His tinnitus  could be related or unrelated given the fact that the schwannoma is on the left side and his tinnitus is on the right side.    Arnaldo Natal, MD

## 2019-01-25 NOTE — Progress Notes (Signed)
Appreciate Neurology input. [facility] (Dr Roseanne Reno) who stated that patient needs to be seen by [facility] at [facility] as their practice does not come to [facility]. [facility] does not round on weekends and will be here Monday. Will go ahead and get CT Head as recommended by Radiology  Called Dr Tasia Catchings with Oncology who stated that Neurosurgery must do tissue diagnosis before oncology gets involved.   Will get CT Head and consult [facility] as soon as possible

## 2019-01-25 NOTE — BHH Group Notes (Signed)
LCSW Group Therapy Note  01/25/2019 1:15pm  Type of Therapy and Topic: Group Therapy: Holding on to Grudges   Participation Level: Active   Description of Group:  In this group patients will be asked to explore and define a grudge. Patients will be guided to discuss their thoughts, feelings, and reasons as to why people have grudges. Patients will process the impact grudges have on daily life and identify thoughts and feelings related to holding grudges. Facilitator will challenge patients to identify ways to let go of grudges and the benefits this provides. Patients will be confronted to address why one struggles letting go of grudges. Lastly, patients will identify feelings and thoughts related to what life would look like without grudges. This group will be process-oriented, with patients participating in exploration of their own experiences, giving and receiving support, and processing challenge from other group members.  Therapeutic Goals:  1. Patient will identify specific grudges related to their personal life.  2. Patient will identify feelings, thoughts, and beliefs around grudges.  3. Patient will identify how one releases grudges appropriately.  4. Patient will identify situations where they could have let go of the grudge, but instead chose to hold on.   Summary of Patient Progress: The patient reported that he feels "a bit more optimisitc." Patients were guided to discuss their thoughts, feelings, and reasons as to why people have grudges. The patient was able to process the impact grudges have on daily life and identified thoughts and feelings related to holding grudges. The patient was challenged to identify ways to let go of grudges and the benefits this provides. Pt. actively and appropriately engaged in the group. Patient was able to provide support and validation to other group members.   Therapeutic Modalities:  Cognitive Behavioral Therapy  Solution Focused Therapy   Motivational Interviewing  Brief Therapy   Leif Loflin  CUEBAS-COLON, LCSW 01/25/2019 9:57 AM

## 2019-01-25 NOTE — Progress Notes (Signed)
Patient taken to CT for CT scan of temporal bone per MD order. Returned in no acute distress

## 2019-01-25 NOTE — Plan of Care (Signed)
D: Patient is very concerned about the mass that was discovered. Went down for CT scan of temporal bone. Asking lots of questions about CT and about plan of care. When asked if he was feeling suicidal, he stated "No, I have bigger problems to worry about." Has been out in the milieu watching basketball game in [place]. Denies complaints at this time. Affect is anxious. Mood is anxious and depressed. A: Continue to monitor for safety and offer support. R: Safety maintained

## 2019-01-25 NOTE — Plan of Care (Signed)
Patient is a 73 year old male who complains of depression with passive suicidal ideation. Patient states,"I'm not doing so well and I am not sleeping at night but I talked with the doctor about it so she's going to try trazodone hopefully it works." Patient is very sullen and flat presents with despair. Patient contracts verbally for safety while on the unit. Patient is logical and coherent in speech and thought clear voice. Denies AVH, rates pain 0/10. Problem: Education: Goal: Knowledge of [facility] Education information/materials will improve Outcome: Not Progressing Goal: Emotional status will improve Outcome: Not Progressing Goal: Mental status will improve Outcome: Not Progressing Goal: Verbalization of understanding the information provided will improve Outcome: Not Progressing   Problem: Health Behavior/Discharge Planning: Goal: Identification of resources available to assist in meeting health care needs will improve Outcome: Not Progressing Goal: Compliance with treatment plan for underlying cause of condition will improve Outcome: Not Progressing   Problem: Coping: Goal: Coping ability will improve Outcome: Not Progressing Goal: Will verbalize feelings Outcome: Not Progressing
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
## 2019-01-25 NOTE — Progress Notes (Signed)
Patient alert and oriented x 4, dines pain or discomfort, affect is flat and sad but brightens upon approach, interacting appropriately with peers and staff no distress noted, appears less anxious, compliant with medication, 15 minutes safety check maintained, will continue to monitor

## 2019-01-26 DIAGNOSIS — H9313 Tinnitus, bilateral: Secondary | ICD-10-CM

## 2019-01-26 LAB — BASIC METABOLIC PANEL
Anion gap: 4 — ABNORMAL LOW (ref 5–15)
BUN: 15 mg/dL (ref 8–23)
CO2: 34 mmol/L — ABNORMAL HIGH (ref 22–32)
Calcium: 9.3 mg/dL (ref 8.9–10.3)
Chloride: 99 mmol/L (ref 98–111)
Creatinine, Ser: 0.96 mg/dL (ref 0.61–1.24)
GFR calc Af Amer: >60 mL/min (ref >60)
GFR calc non Af Amer: >60 mL/min (ref >60)
Glucose, Bld: 101 mg/dL — ABNORMAL HIGH (ref 70–99)
Potassium: 4.5 mmol/L (ref 3.5–5.1)
SODIUM: 137 mmol/L (ref 135–145)

## 2019-01-26 LAB — LIPID PANEL
Cholesterol: 130 mg/dL (ref 0–200)
HDL: 37 mg/dL — ABNORMAL LOW (ref >40)
LDL Cholesterol: 74 mg/dL (ref 0–99)
Total CHOL/HDL Ratio: 3.5 RATIO
Triglycerides: 94 mg/dL (ref <150)
VLDL: 19 mg/dL (ref 0–40)

## 2019-01-26 LAB — HEMOGLOBIN A1C
Hgb A1c MFr Bld: 5.5 % (ref 4.8–5.6)
Mean Plasma Glucose: 111.15 mg/dL

## 2019-01-26 MED ORDER — TRAZODONE HCL 100 MG PO TABS
150.0000 mg | ORAL_TABLET | Freq: Every day | ORAL | Status: DC
  Administered 2019-01-26: 150 mg via ORAL
  Filled 2019-01-26: qty 2

## 2019-01-26 MED ORDER — IBUPROFEN 600 MG PO TABS
800.0000 mg | ORAL_TABLET | Freq: Three times a day (TID) | ORAL | Status: DC | PRN
  Administered 2019-01-26 – 2019-01-29 (×7): 800 mg via ORAL
  Filled 2019-01-26 (×7): qty 1

## 2019-01-26 NOTE — Progress Notes (Signed)
D:Patient still appears flat and depressed. Reporting back pain 10/10 and complaining about his bed being too low. Medicated per prn order. Asking questions about the Neurosurgery consult he has tomorrow. Denies SI. Contracts for safety. Mood is sad and hopeless. Somewhat needy. A: Continue to monitor and offer support R: Safety maintained

## 2019-01-26 NOTE — BHH Counselor (Signed)
Adult Comprehensive Assessment  Patient ID: Ruben Duncan, male   DOB: 1946-05-20, 73 y.o.   MRN: [id number]  Information Source: Information source: Patient  Current Stressors:  Patient states their primary concerns and needs for treatment are:: "I was feeling suicidal" Patient states their goals for this hospitilization and ongoing recovery are:: "to get over that feeling to do myself in" Educational / Learning stressors: none reported Employment / Job issues: none reported Family Relationships: "okayPublishing copy / Lack of resources (include bankruptcy): retired  Optometrist / Lack of housing: stable Physical health (include injuries & life threatening diseases): tinnitus Social relationships: "good" Substance abuse: none reported Bereavement / Loss: "I've lost my desire to do anything"  Living/Environment/Situation:  Living Arrangements: Spouse/significant other Living conditions (as described by patient or guardian): "good" Who else lives in the home?: spouse How long has patient lived in current situation?: since 08/2018 What is atmosphere in current home: Comfortable  Family History:  Marital status: Married Number of Years Married: 39 Are you sexually active?: Yes What is your sexual orientation?: heterosexual Has your sexual activity been affected by drugs, alcohol, medication, or emotional stress?: no Does patient have children?: Yes How many children?: 2 How is patient's relationship with their children?: "good"  Childhood History:  By whom was/is the patient raised?: Both parents Description of patient's relationship with caregiver when they were a child: "good" Patient's description of current relationship with people who raised him/her:   Does patient have siblings?: Yes Number of Siblings: 5 Description of patient's current relationship with siblings: "once in a while we talk on the phone" Did patient suffer any verbal/emotional/physical/sexual abuse as a child?:  No Did patient suffer from severe childhood neglect?: No Has patient ever been sexually abused/assaulted/raped as an adolescent or adult?: No Was the patient ever a victim of a crime or a disaster?: No Witnessed domestic violence?: No Has patient been effected by domestic violence as an adult?: No  Education:  Highest grade of school patient has completed: Restaurant manager, fast food degree Currently a student?: No Learning disability?: No  Employment/Work Situation:   Employment situation: Retired Archivist job has been impacted by current illness: No What is the longest time patient has a held a job?: 32 years Where was the patient employed at that time?: [employer] in [place] Did You Receive Any Psychiatric Treatment/Services While in the [employer]?: No Are There Guns or Other Weapons in [place]?: No  Financial Resources:   Financial resources: Income from employment, Private insurance Does patient have a representative payee or guardian?: No  Alcohol/Substance Abuse:   What has been your use of drugs/alcohol within the last 12 months?: none reported If attempted suicide, did drugs/alcohol play a role in this?: No Alcohol/Substance Abuse Treatment Hx: Denies past history Has alcohol/substance abuse ever caused legal problems?: No  Social Support System:   Pensions consultant Support System: Fair Astronomer System: family Type of faith/religion: Catholic How does patient's faith help to cope with current illness?: "I don't feel strong about it"  Leisure/Recreation:   Leisure and Hobbies: hiking, canoeing  Strengths/Needs:   What is the patient's perception of their strengths?: "At this point I don't feel like I am good at anything. I am good communicating and empathizing" Patient states they can use these personal strengths during their treatment to contribute to their recovery: "by participating as much as  I can" Patient states these barriers may affect/interfere with their  treatment: no Patient states these barriers may affect their return  to the community: no  Discharge Plan:   Currently receiving community mental health services: Yes (From Whom) Patient states concerns and preferences for aftercare planning are: TBD with CSW- Pt reports he has a psychiatrist in [place] but he does not know if he wants to see him again. Patient states they will know when they are safe and ready for discharge when: "when I don't feelike hurting myself" Does patient have access to transportation?: Yes Does patient have financial barriers related to discharge medications?: No Will patient be returning to same living situation after discharge?: Yes  Summary/Recommendations:  Patient is a 73 year old male admitted voluntarily and diagnosed with Major Depressive Disorder. Patient has a history of recurrent major depression and anxiety who was brought to the hospital after he had suicidal thoughts with a plan to cut his wrist.  He has been struggling with worsening depressive symptoms over the past 2 years.  He says his depression worsened in November around Thanksgiving time and he has been on Cymbalta 60 mg p.o. daily since the beginning of January 10.  Prior to January 10, he had started on Celexa but it caused tinnitus.  He has had bilateral tinnitus since starting Celexa which he feels has worsened his depression. Patient will benefit from crisis stabilization, medication evaluation, group therapy and psychoeducation. In addition to case management for discharge planning. At discharge it is recommended that patient adhere to the established discharge plan and continue treatment.     Sherrika Weakland  CUEBAS-COLON. 01/26/2019

## 2019-01-26 NOTE — Progress Notes (Signed)
D: Patient is very concerned about the mass that was discovered. Went down for CT scan of temporal bone. Asking lots of questions about CT and about plan of care. When asked if he was feeling suicidal, he stated "No, I have bigger problems to worry about." Has been out in the milieu watching basketball game in [place]. Denies complaints at this time. Affect is anxious. Mood is anxious and depressed. A: Continue to monitor for safety and offer support. R: Safety maintained

## 2019-01-26 NOTE — Progress Notes (Signed)
Patient presents with sad, flat affect. Complains of the amount of medications he has to take. Reports he is concerned about the mass that was discovered on his brain. Denies SI at present. Request to speak with Education officer, museum. Encouragement and support provided. Medications given as prescribed.  Safety checks maintained. Pt receptive and remains safe on unit with q 15 min checks.

## 2019-01-26 NOTE — Plan of Care (Signed)
  Problem: Education: Goal: Knowledge of [place] [facility] Education information/materials will improve Outcome: Progressing Goal: Mental status will improve Outcome: Progressing Goal: Verbalization of understanding the information provided will improve Outcome: Progressing   Problem: Health Behavior/Discharge Planning: Goal: Compliance with treatment plan for underlying cause of condition will improve Outcome: Progressing   Problem: Coping: Goal: Coping ability will improve Outcome: Progressing Goal: Will verbalize feelings Outcome: Progressing   Problem: Education: Goal: Emotional status will improve Outcome: Not Progressing

## 2019-01-26 NOTE — Progress Notes (Signed)
[facility] MD Progress Note  01/26/2019 8:29 AM Ruben Duncan  MRN:  [id number]    Subjective:   The patient did not sleep as well last night.  He was more worried about recent MRI results.  Time spent discussing findings on both MRI of the head as well as a CT of the head including possible schwannoma.  He is aware that neurosurgery will be consulted in coming hopefully tomorrow as services are not available over the weekend.  The patient says he is no longer suicidal as he is now more concerned and worried about the brain mass.  The patient denies any current active or passive suicidal thoughts but mood remains depressed.  He denies any current auditory visual hallucinations.  He denies any paranoid thoughts or delusions.  He slept approximately 5 hours last night.  Appetite is fair.  He has been visible on the unit and attempting attending groups.  Affect was brighter today than yesterday.  No somatic complaints other than bilateral tinnitus.  Vital signs are stable.   Past psychiatric history: The patient was hospitalized several times in the past in [place] as well as [place] in January for 8 days. He had ECT in the 1980s. He is not currently seeing a psychiatrist and has been getting psychotropic medications from his PCP.  He is currently on Cymbalta 60 mg p.o. daily since mid January and had been on Cymbalta approximately 2 years ago.  He had tendinitis with Celexa.  No prior suicide attempts.  He is not currently in any individual therapy but did have therapy in the past  PMH: GERD HTN Tinnnitus since January, 2020 BPH BL [place] [facility]   Social history: The patient was born and raised in [place] by both his biological parents.  He says they never divorced or separated.  He denies any history of any physical or sexual abuse.  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  He has been married for over 1 years and has 1 daughter in [place] and one son in [place].  Family psychiatric history:  The patient reports that his daughter struggling with depression and anxiety and he has multiple siblings with depression and anxiety  Substance abuse history:  The patient does have a history of alcohol use disorder and drank alcohol heavily until 2018.  He would drink 5-6 beers +2 or 3 shots every night.  He denies any history of any cocaine, opiate or stimulant use.  No marijuana use.  No tobacco use in the past.  Legal history:  He says he almost had 1 DUI once but was not officially charged.  He says his guns were taken away from him by his son-in-law.     Principal Problem: <principal problem not specified> Diagnosis: Active Problems:   Severe recurrent major depression without psychotic features (HCC)   GERD (gastroesophageal reflux disease)   Tinnitus   Generalized anxiety disorder  Total Time spent with patient: 30 minutes    Social History:  Social History   Substance and Sexual Activity  Alcohol Use Not on file     Social History   Substance and Sexual Activity  Drug Use Not on file    Social History   Socioeconomic History  . Marital status: Married    Spouse name: Not on file  . Number of children:  Not on file  . Years of education: Not on file  . Highest education level: Not on file  Occupational History  . Not on file  Social Needs  . Financial resource strain: Not on file  . Food insecurity:    Worry: Not on file    Inability: Not on file  . Transportation needs:    Medical: Not on file    Non-medical: Not on file  Tobacco Use  . Smoking status: Never Smoker  . Smokeless tobacco: Never Used  Substance and Sexual Activity  . Alcohol use: Not on file  . Drug use: Not on file  . Sexual activity: Not on file  Lifestyle  . Physical activity:    Days per week: Not on file    Minutes per session:  Not on file  . Stress: Not on file  Relationships  . Social connections:    Talks on phone: Not on file    Gets together: Not on file    Attends religious service: Not on file    Active member of club or organization: Not on file    Attends meetings of clubs or organizations: Not on file    Relationship status: Not on file  Other Topics Concern  . Not on file  Social History Narrative  . Not on file   Sleep: Fair  Appetite:  Fair  Current Medications: Current Facility-Administered Medications  Medication Dose Route Frequency Provider Last Rate Last Dose  . acetaminophen (TYLENOL) tablet 650 mg  650 mg Oral Q6H PRN Clapacs, John T, MD      . alum & mag hydroxide-simeth (MAALOX/MYLANTA) 200-200-20 MG/5ML suspension 30 mL  30 mL Oral Q4H PRN Clapacs, John T, MD      . ARIPiprazole (ABILIFY) tablet 5 mg  5 mg Oral Daily Chauncey Mann, MD   5 mg at 01/25/19 0851  . clonazePAM (KLONOPIN) tablet 0.25 mg  0.25 mg Oral QPC supper Chauncey Mann, MD   0.25 mg at 01/25/19 1652  . DULoxetine (CYMBALTA) DR capsule 60 mg  60 mg Oral Daily Clapacs, Madie Reno, MD   60 mg at 01/25/19 0849  . gabapentin (NEURONTIN) capsule 300 mg  300 mg Oral TID Clapacs, John T, MD   300 mg at 01/25/19 1643  . hydrochlorothiazide (MICROZIDE) capsule 12.5 mg  12.5 mg Oral Daily Clapacs, John T, MD   12.5 mg at 01/25/19 0850  . hydrOXYzine (ATARAX/VISTARIL) tablet 25 mg  25 mg Oral TID PRN Chauncey Mann, MD      . magnesium hydroxide (MILK OF MAGNESIA) suspension 30 mL  30 mL Oral Daily PRN Clapacs, John T, MD      . pantoprazole (PROTONIX) EC tablet 40 mg  40 mg Oral Daily Clapacs, Madie Reno, MD   40 mg at 01/25/19 0850  . tamsulosin (FLOMAX) capsule 0.4 mg  0.4 mg Oral Daily Clapacs, Madie Reno, MD   0.4 mg at 01/25/19 0849  . timolol (TIMOPTIC) 0.5 % ophthalmic solution 1 drop  1 drop Both Eyes BID Clapacs, Madie Reno, MD   1 drop at 01/25/19 1645  . traZODone (DESYREL) tablet 150 mg  150 mg Oral QHS Chauncey Mann, MD         Lab Results:  Results for orders placed or performed during the hospital encounter of 01/24/19 (from the past 48 hour(s))  Basic metabolic panel     Status: Abnormal   Collection Time: 01/26/19  6:36 AM  Result Value  Ref Range   Sodium 137 135 - 145 mmol/L   Potassium 4.5 3.5 - 5.1 mmol/L   Chloride 99 98 - 111 mmol/L   CO2 34 (H) 22 - 32 mmol/L   Glucose, Bld 101 (H) 70 - 99 mg/dL   BUN 15 8 - 23 mg/dL   Creatinine, Ser 0.96 0.61 - 1.24 mg/dL   Calcium 9.3 8.9 - 10.3 mg/dL   GFR calc non Af Amer >60 >60 mL/min   GFR calc Af Amer >60 >60 mL/min   Anion gap 4 (L) 5 - 15    Comment: Performed at [facility], [address] [place], [place] [address]  Lipid panel     Status: Abnormal   Collection Time: 01/26/19  6:36 AM  Result Value Ref Range   Cholesterol 130 0 - 200 mg/dL   Triglycerides 94 <150 mg/dL   HDL 37 (L) >40 mg/dL   Total CHOL/HDL Ratio 3.5 RATIO   VLDL 19 0 - 40 mg/dL   LDL Cholesterol 74 0 - 99 mg/dL    Comment:        Total Cholesterol/HDL:CHD Risk Coronary Heart Disease Risk Table                     Men   Women  1/2 Average Risk   3.4   3.3  Average Risk       5.0   4.4  2 X Average Risk   9.6   7.1  3 X Average Risk  23.4   11.0        Use the calculated Patient Ratio above and the CHD Risk Table to determine the patient's CHD Risk.        ATP III CLASSIFICATION (LDL):  <100     mg/dL   Optimal  100-129  mg/dL   Near or Above                    Optimal  130-159  mg/dL   Borderline  160-189  mg/dL   High  >190     mg/dL   Very High Performed at [facility], [address] [place], [place] [address]     Blood Alcohol level:  Lab Results  Component Value Date   [facility] <10 06/16/1600    Metabolic Disorder Labs: No results found for: HGBA1C, MPG No results found for: PROLACTIN Lab Results  Component Value Date   CHOL 130 01/26/2019   TRIG 94 01/26/2019   HDL 37 (L) 01/26/2019   CHOLHDL 3.5 01/26/2019   VLDL 19  01/26/2019   LDLCALC 74 01/26/2019     Musculoskeletal: Strength & Muscle Tone: within normal limits Gait & Station: normal Patient leans: N/A  Psychiatric Specialty Exam: Physical Exam  Nursing note and vitals reviewed.   Review of Systems  Constitutional: Negative.   HENT:       Bilateral tinnitus  Eyes: Negative.   Respiratory: Negative.   Cardiovascular: Negative.   Gastrointestinal: Negative.   Musculoskeletal: Negative.   Skin: Negative.   Neurological: Negative for dizziness, tingling, tremors, sensory change, focal weakness, seizures, loss of consciousness, weakness and headaches.  Endo/Heme/Allergies: Negative.     Blood pressure 103/81, pulse 71, temperature 97.6 F (36.4 C), temperature source Oral, resp. rate 18, height 5\' 9"  (1.753 m), weight 89.4 kg, SpO2 99 %.Body mass index is 29.09 kg/m.  General Appearance: Casual  Eye Contact:  Good  Speech:  Clear and Coherent and  Normal Rate  Volume:  Normal  Mood:  Depressed  Affect:  Congruent and Constricted  Thought Process:  Coherent, Goal Directed and Linear  Orientation:  Full (Time, Place, and Person)  Thought Content:  Logical  Suicidal Thoughts:  No  Homicidal Thoughts:  No  Memory:  Immediate;   Good Recent;   Good Remote;   Good  Judgement:  Good  Insight:  Good  Psychomotor Activity:  Normal  Concentration:  Concentration: Good and Attention Span: Good  Recall:  Good  Fund of Knowledge:  Good  Language:  Good  Akathisia:  No  Handed:  Right  AIMS (if indicated):     Assets:  Museum/gallery curator Physical Health Social Support Transportation Vocational/Educational  ADL's:  Intact  Cognition:  WNL  Sleep:  Number of Hours: 5.75     Treatment Plan Summary:  Major depressive disorder, recurrent, severe without psychosis Anxiety disorder, NOS. Alcohol use disorder in full remission Tinnitus GERD Mild: Chronic medical condition   [name]  is a 73 year old married Caucasian male with history of recurrent major depression and anxiety who was voluntarily brought to the hospital secondary to suicidal thoughts and a plan to cut his wrist.  The patient will be admitted to inpatient psychiatry for medication management, safety and stabilization.  Major depressive disorder, recurrent, severe without psychosis, anxiety disorder, NOS:  -The patient was started on Abilify 5 mg p.o. daily as an adjunct for depression in addition to the Cymbalta 60 mg p.o. daily. -Will increase trazodone to a total of 150 mg p.o. nightly for insomnia -Total cholesterol was 130 and hemoglobin A1c is pending -QTC was 448 on EKG  Alcohol use disorder and formation: The patient denies any alcohol use since 2018.  Prior to that, he was drinking on a daily basis.  No other illicit drug use and urine tox Marylou Mccoy was negative for all substances.  Tinnitus: Appreciate neurology input.  MRI of the brain and CT of the Temporal Bones completed. Neurosurgery consult placed for Monday with Dr Lacinda Axon. Will await results from Neurosurgery. 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 Recommend imaging follow-up. CT of the skull base may also be useful in evaluating for erosion versus scalloping. 2. There is mass effect on the internal jugular vein which could be a cause of tinnitus.  HTN -VSS -Continue HCTZ 12.5mg  po daily   BPH -Continue Tamsuosin  Glaucoma -Continue eye drops  Disposition: The patient does have a stable living situation with his wife in [place], [place].  He will need psychotropic medication management follow-up appointment after discharg    Daily contact with patient to assess and evaluate symptoms and progress in treatment and Medication management  Chauncey Mann, MD 01/26/2019, 8:29 AM

## 2019-01-26 NOTE — BHH Group Notes (Signed)
LCSW Group Therapy Note 01/26/2019 1:15pm  Type of Therapy and Topic: Group Therapy: Feelings Around Returning Home & Establishing a Supportive Framework and Supporting Oneself When Supports Not Available  Participation Level: Active  Description of Group:  Patients first processed thoughts and feelings about upcoming discharge. These included fears of upcoming changes, lack of change, new living environments, judgements and expectations from others and overall stigma of mental health issues. The group then discussed the definition of a supportive framework, what that looks and feels like, and how do to discern it from an unhealthy non-supportive network. The group identified different types of supports as well as what to do when your family/friends are less than helpful or unavailable  Therapeutic Goals  1. Patient will identify one healthy supportive network that they can use at discharge. 2. Patient will identify one factor of a supportive framework and how to tell it from an unhealthy network. 3. Patient able to identify one coping skill to use when they do not have positive supports from others. 4. Patient will demonstrate ability to communicate their needs through discussion and/or role plays.  Summary of Patient Progress:  The patient reports that he feels "anxious." Pt engaged during group session. As patients processed their anxiety about discharge and described healthy supports patient shared he is not ready to be discharge. He listed his wife and his daughter as his main support.  Patients identified at least one self-care tool they were willing to use after discharge.   Therapeutic Modalities Cognitive Behavioral Therapy Motivational Interviewing   Eniya Cannady  CUEBAS-COLON, LCSW 01/26/2019 12:41 PM

## 2019-01-26 NOTE — Plan of Care (Signed)
D:Patient still appears flat and depressed. Reporting back pain 10/10 and complaining about his bed being too low. Medicated per prn order. Asking questions about the Neurosurgery consult he has tomorrow. Denies SI. Contracts for safety. Mood is sad and hopeless. Somewhat needy. A: Continue to monitor and offer support R: Safety maintained

## 2019-01-27 MED ORDER — METHOCARBAMOL 500 MG PO TABS
500.0000 mg | ORAL_TABLET | Freq: Three times a day (TID) | ORAL | Status: DC
  Administered 2019-01-27 – 2019-01-29 (×5): 500 mg via ORAL
  Filled 2019-01-27 (×7): qty 1

## 2019-01-27 MED ORDER — TRAZODONE HCL 100 MG PO TABS
100.0000 mg | ORAL_TABLET | Freq: Every day | ORAL | Status: DC
  Administered 2019-01-27 – 2019-01-28 (×2): 100 mg via ORAL
  Filled 2019-01-27 (×2): qty 1

## 2019-01-27 MED ORDER — CLONAZEPAM 0.5 MG PO TABS
0.5000 mg | ORAL_TABLET | Freq: Every day | ORAL | Status: DC
  Administered 2019-01-27 – 2019-01-28 (×2): 0.5 mg via ORAL
  Filled 2019-01-27 (×2): qty 1

## 2019-01-27 MED ORDER — CLONAZEPAM 0.5 MG PO TABS: 0.2500 mg | ORAL_TABLET | Freq: Every day | ORAL | Status: DC

## 2019-01-27 NOTE — Plan of Care (Signed)
Pt seen in the milieu. Present in the break room but not socializing with peers. Affect is blunt and mood is depressed. When asked how he was doing today, Pt shrugged his shoulders and stated he was okay. Q15 min safety checks maintained. Problem: Education: Goal: Knowledge of [place] [facility] Education information/materials will improve Outcome: Progressing Goal: Emotional status will improve Outcome: Progressing Goal: Mental status will improve Outcome: Progressing Goal: Verbalization of understanding the information provided will improve Outcome: Progressing   Problem: Health Behavior/Discharge Planning: Goal: Identification of resources available to assist in meeting health care needs will improve Outcome: Progressing Goal: Compliance with treatment plan for underlying cause of condition will improve Outcome: Progressing   Problem: Coping: Goal: Coping ability will improve Outcome: Progressing Goal: Will verbalize feelings Outcome: Progressing   Problem: Role Relationship: Goal: Will demonstrate positive changes in social behaviors and relationships Outcome: Progressing

## 2019-01-27 NOTE — BHH Group Notes (Signed)
[facility] Group Notes:  (Nursing/MHT/Case Management/Adjunct)  Date:  01/27/2019  Time:  11:41 PM  Type of Therapy:  Group Therapy  Participation Level:  Active  Participation Quality:  Appropriate  Affect:  Appropriate  Cognitive:  Appropriate  Insight:  Appropriate  Engagement in Group:  Engaged  Modes of Intervention:  Discussion  Summary of Progress/Problems:  Kandis Fantasia 01/27/2019, 11:41 PM

## 2019-01-27 NOTE — Progress Notes (Signed)
D: Patient has been anxious today.  He is expecting a consult visit from Dr. Cook/Neurology.  Dr. Lacinda Axon came down this morning, however, patient was in group.  Patient did meet with his this afternoon and wanted to call his wife, which he did.  He was also given robaxin for his back pain.  He denies any thoughts of self harm.  A: Continue to monitor medication management and MD orders.  Safety checks completed every 15 minutes per protocol.  Offer support and encouragement as needed.  R: Patient is receptive to staff; his/her behavior is appropriate.

## 2019-01-27 NOTE — Tx Team (Addendum)
Interdisciplinary Treatment and Diagnostic Plan Update  01/27/2019 Time of Session: 2:30pm Ruben Duncan MRN: [id number]  Principal Diagnosis: <principal problem not specified>  Secondary Diagnoses: Active Problems:   Severe recurrent major depression without psychotic features (HCC)   GERD (gastroesophageal reflux disease)   Tinnitus   Generalized anxiety disorder   Current Medications:  Current Facility-Administered Medications  Medication Dose Route Frequency Provider Last Rate Last Dose  . acetaminophen (TYLENOL) tablet 650 mg  650 mg Oral Q6H PRN Clapacs, Madie Reno, MD   650 mg at 01/26/19 1617  . alum & mag hydroxide-simeth (MAALOX/MYLANTA) 200-200-20 MG/5ML suspension 30 mL  30 mL Oral Q4H PRN Clapacs, John T, MD      . ARIPiprazole (ABILIFY) tablet 5 mg  5 mg Oral Daily Chauncey Mann, MD   5 mg at 01/27/19 0998  . clonazePAM (KLONOPIN) tablet 0.25 mg  0.25 mg Oral QPC supper Chauncey Mann, MD   0.25 mg at 01/26/19 1804  . clonazePAM (KLONOPIN) tablet 0.5 mg  0.5 mg Oral QHS Clapacs, John T, MD      . DULoxetine (CYMBALTA) DR capsule 60 mg  60 mg Oral Daily Clapacs, Madie Reno, MD   60 mg at 01/27/19 3382  . gabapentin (NEURONTIN) capsule 300 mg  300 mg Oral TID Clapacs, John T, MD   300 mg at 01/27/19 1222  . hydrochlorothiazide (MICROZIDE) capsule 12.5 mg  12.5 mg Oral Daily Clapacs, John T, MD   12.5 mg at 01/27/19 5053  . hydrOXYzine (ATARAX/VISTARIL) tablet 25 mg  25 mg Oral TID PRN Chauncey Mann, MD   25 mg at 01/27/19 1222  . ibuprofen (ADVIL,MOTRIN) tablet 800 mg  800 mg Oral Q8H PRN Lavella Hammock, MD   800 mg at 01/27/19 9767  . magnesium hydroxide (MILK OF MAGNESIA) suspension 30 mL  30 mL Oral Daily PRN Clapacs, John T, MD      . methocarbamol (ROBAXIN) tablet 500 mg  500 mg Oral TID Clapacs, Madie Reno, MD   500 mg at 01/27/19 1434  . pantoprazole (PROTONIX) EC tablet 40 mg  40 mg Oral Daily Clapacs, Madie Reno, MD   40 mg at 01/27/19 3419  . tamsulosin (FLOMAX) capsule 0.4  mg  0.4 mg Oral Daily Clapacs, Madie Reno, MD   0.4 mg at 01/27/19 3790  . timolol (TIMOPTIC) 0.5 % ophthalmic solution 1 drop  1 drop Both Eyes BID Clapacs, Madie Reno, MD   1 drop at 01/27/19 [phone]  . traZODone (DESYREL) tablet 100 mg  100 mg Oral QHS Clapacs, John T, MD       PTA Medications: Medications Prior to Admission  Medication Sig Dispense Refill Last Dose  . B Complex-C (B-COMPLEX WITH VITAMIN C) tablet Take 1 tablet by mouth daily.   Past Month at Unknown time  . Cholecalciferol (VITAMIN D3) 25 MCG (1000 UT) CAPS Take 1,000 mg by mouth daily.   today  . clonazePAM (KLONOPIN) 0.5 MG tablet Take 0.5 mg by mouth at bedtime.    01/22/2019 at Unknown time  . DULoxetine (CYMBALTA) 60 MG capsule Take 60 mg by mouth daily.   01/22/2019  . gabapentin (NEURONTIN) 300 MG capsule Take 300 mg by mouth 3 (three) times daily.   01/23/2019 at 1230  . hydrochlorothiazide (MICROZIDE) 12.5 MG capsule Take 12.5 mg by mouth daily.   01/23/2019 at Unknown time  . omeprazole (PRILOSEC) 20 MG capsule Take 20 mg by mouth daily.   01/22/2019 at Unknown time  .  tamsulosin (FLOMAX) 0.4 MG CAPS capsule Take 0.4 mg by mouth daily.   Past Month at Unknown time  . timolol (TIMOPTIC) 0.5 % ophthalmic solution Place 1 drop into both eyes 2 (two) times daily.   01/23/2019 at Unknown time  . zolpidem (AMBIEN) 10 MG tablet Take 10 mg by mouth at bedtime as needed.    01/22/2019 at Unknown time    Patient Stressors: Health problems Traumatic event  Patient Strengths: Ability for insight Average or above average intelligence Communication skills Financial means Supportive family/friends  Treatment Modalities: Medication Management, Group therapy, Case management,  1 to 1 session with clinician, Psychoeducation, Recreational therapy.   Physician Treatment Plan for Primary Diagnosis: <principal problem not specified> Long Term Goal(s): Improvement in symptoms so as ready for discharge Improvement in symptoms so as ready for  discharge   Short Term Goals: Ability to verbalize feelings will improve Ability to disclose and discuss suicidal ideas Ability to identify and develop effective coping behaviors will improve Ability to identify triggers associated with substance abuse/mental health issues will improve Ability to verbalize feelings will improve Ability to disclose and discuss suicidal ideas Ability to demonstrate self-control will improve Ability to identify and develop effective coping behaviors will improve Ability to identify triggers associated with substance abuse/mental health issues will improve  Medication Management: Evaluate patient's response, side effects, and tolerance of medication regimen.  Therapeutic Interventions: 1 to 1 sessions, Unit Group sessions and Medication administration.  Evaluation of Outcomes: Progressing  Physician Treatment Plan for Secondary Diagnosis: Active Problems:   Severe recurrent major depression without psychotic features (HCC)   GERD (gastroesophageal reflux disease)   Tinnitus   Generalized anxiety disorder  Long Term Goal(s): Improvement in symptoms so as ready for discharge Improvement in symptoms so as ready for discharge   Short Term Goals: Ability to verbalize feelings will improve Ability to disclose and discuss suicidal ideas Ability to identify and develop effective coping behaviors will improve Ability to identify triggers associated with substance abuse/mental health issues will improve Ability to verbalize feelings will improve Ability to disclose and discuss suicidal ideas Ability to demonstrate self-control will improve Ability to identify and develop effective coping behaviors will improve Ability to identify triggers associated with substance abuse/mental health issues will improve     Medication Management: Evaluate patient's response, side effects, and tolerance of medication regimen.  Therapeutic Interventions: 1 to 1 sessions, Unit  Group sessions and Medication administration.  Evaluation of Outcomes: Progressing   RN Treatment Plan for Primary Diagnosis: <principal problem not specified> Long Term Goal(s): Knowledge of disease and therapeutic regimen to maintain health will improve  Short Term Goals: Ability to demonstrate self-control, Ability to participate in decision making will improve, Ability to verbalize feelings will improve and Compliance with prescribed medications will improve  Medication Management: RN will administer medications as ordered by provider, will assess and evaluate patient's response and provide education to patient for prescribed medication. RN will report any adverse and/or side effects to prescribing provider.  Therapeutic Interventions: 1 on 1 counseling sessions, Psychoeducation, Medication administration, Evaluate responses to treatment, Monitor vital signs and CBGs as ordered, Perform/monitor CIWA, COWS, AIMS and Fall Risk screenings as ordered, Perform wound care treatments as ordered.  Evaluation of Outcomes: Progressing   LCSW Treatment Plan for Primary Diagnosis: <principal problem not specified> Long Term Goal(s): Safe transition to appropriate next level of care at discharge, Engage patient in therapeutic group addressing interpersonal concerns.  Short Term Goals: Engage patient in aftercare planning  with referrals and resources, Increase social support, Increase ability to appropriately verbalize feelings and Increase emotional regulation  Therapeutic Interventions: Assess for all discharge needs, 1 to 1 time with Social worker, Explore available resources and support systems, Assess for adequacy in community support network, Educate family and significant other(s) on suicide prevention, Complete Psychosocial Assessment, Interpersonal group therapy.  Evaluation of Outcomes: Progressing   Progress in Treatment: Attending groups: Yes. Participating in groups: Yes. Taking  medication as prescribed: Yes. Toleration medication: Yes. Family/Significant other contact made: No, will contact:  once permission given Patient understands diagnosis: Yes. Discussing patient identified problems/goals with staff: Yes. Medical problems stabilized or resolved: Yes.  Patient has met with neurosurgeon regarding brain tumor.  Denies suicidal/homicidal ideation: Yes. Issues/concerns per patient self-inventory: No. Other: none   New problem(s) identified: No, Describe:  none  New Short Term/Long Term Goal(s): medication management for mood stabilization; elimination of SI thoughts; development of comprehensive mental wellness  Patient Goals:  "more interaction with staff and patients"  Discharge Plan or Barriers: Patient reports that he had an appointment with Dr. Ready at [facility] and he would like to have the appointment rescheduled.  Reason for Continuation of Hospitalization: Anxiety Depression Medication stabilization  Estimated Length of Stay: 1-5 days   Recreational Therapy: Patient Stressors: N/A Patient Goal: Patient will engage in groups without prompting or encouragement from LRT x3 group sessions within 5 recreation therapy group sessions  Attendees: Patient: Currie Dennin 01/27/2019 3:07 PM  Physician: Dr. Weber Cooks, MD 01/27/2019 3:07 PM  Nursing: Polly Cobia, RN 01/27/2019 3:07 PM  RN Care Manager: 01/27/2019 3:07 PM  Social Worker: Assunta Curtis, [place] 01/27/2019 3:07 PM  Recreational Therapist:  01/27/2019 3:07 PM  Other:  01/27/2019 3:07 PM  Other:  01/27/2019 3:07 PM  Other: 01/27/2019 3:07 PM    Scribe for Treatment Team: Rozann Lesches, LCSW 01/27/2019 3:07 PM

## 2019-01-27 NOTE — Progress Notes (Signed)
[facility] MD Progress Note  01/27/2019 4:56 PM Ruben Duncan  MRN:  [id number] Subjective: Follow-up for this 73 year old man with major depression.  Patient seen chart reviewed.  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  Patient spoke with neurosurgery today.  It seems like it is impossible to tell whether this could be causing his symptoms of tendinitis without doing surgery on it.  They are going to follow-up with him about it.  Meanwhile however it has certainly focused his attention and he is no longer reporting any suicidal ideation.  Still depressed and anxious however.  No psychotic symptoms.  No acute suicidality.  Tolerating medicine okay.  Tinnitus is still pretty bad. Principal Problem: Severe recurrent major depression without psychotic features ([place]) Diagnosis: Principal Problem:   Severe recurrent major depression without psychotic features ([place]) Active Problems:   GERD (gastroesophageal reflux disease)   Tinnitus   Generalized anxiety disorder  Total Time spent with patient: 30 minutes  Past Psychiatric History: Past history of recurrent episodes of depression.  Medication has been more helpful in the past but recently had been difficult to manage  Past Medical History: History reviewed. No pertinent past medical history. History reviewed. No pertinent surgical history. Family History: History reviewed. No pertinent family history. Family Psychiatric  History: See previous no known family history Social History:  Social History   Substance and Sexual Activity  Alcohol Use Not on file     Social History   Substance and Sexual Activity  Drug Use Not on file    Social History   Socioeconomic History  . Marital status: Married    Spouse name: Not on file  . Number of children: Not on file  . Years of education: Not on file  . Highest education level: Not on file  Occupational History  . Not on file   Social Needs  . Financial resource strain: Not on file  . Food insecurity:    Worry: Not on file    Inability: Not on file  . Transportation needs:    Medical: Not on file    Non-medical: Not on file  Tobacco Use  . Smoking status: Never Smoker  . Smokeless tobacco: Never Used  Substance and Sexual Activity  . Alcohol use: Not on file  . Drug use: Not on file  . Sexual activity: Not on file  Lifestyle  . Physical activity:    Days per week: Not on file    Minutes per session: Not on file  . Stress: Not on file  Relationships  . Social connections:    Talks on phone: Not on file    Gets together: Not on file    Attends religious service: Not on file    Active member of club or organization: Not on file    Attends meetings of clubs or organizations: Not on file    Relationship status: Not on file  Other Topics Concern  . Not on file  Social History Narrative  . Not on file   Additional Social History:                         Sleep: Fair  Appetite:  Fair  Current Medications: Current Facility-Administered Medications  Medication Dose Route Frequency Provider Last Rate Last Dose  . acetaminophen (TYLENOL) tablet 650 mg  650 mg Oral Q6H PRN Krithika Tome, Madie Reno, MD  650 mg at 01/26/19 1617  . alum & mag hydroxide-simeth (MAALOX/MYLANTA) 200-200-20 MG/5ML suspension 30 mL  30 mL Oral Q4H PRN Cap Massi T, MD      . ARIPiprazole (ABILIFY) tablet 5 mg  5 mg Oral Daily Chauncey Mann, MD   5 mg at 01/27/19 4098  . clonazePAM (KLONOPIN) tablet 0.25 mg  0.25 mg Oral QPC supper Chauncey Mann, MD   0.25 mg at 01/26/19 1804  . clonazePAM (KLONOPIN) tablet 0.5 mg  0.5 mg Oral QHS Kayzlee Wirtanen T, MD      . DULoxetine (CYMBALTA) DR capsule 60 mg  60 mg Oral Daily Bonnie Roig, Madie Reno, MD   60 mg at 01/27/19 1191  . gabapentin (NEURONTIN) capsule 300 mg  300 mg Oral TID Tayshaun Kroh T, MD   300 mg at 01/27/19 1222  . hydrochlorothiazide (MICROZIDE) capsule 12.5 mg  12.5 mg  Oral Daily Takeem Krotzer T, MD   12.5 mg at 01/27/19 4782  . hydrOXYzine (ATARAX/VISTARIL) tablet 25 mg  25 mg Oral TID PRN Chauncey Mann, MD   25 mg at 01/27/19 1222  . ibuprofen (ADVIL,MOTRIN) tablet 800 mg  800 mg Oral Q8H PRN Lavella Hammock, MD   800 mg at 01/27/19 9562  . magnesium hydroxide (MILK OF MAGNESIA) suspension 30 mL  30 mL Oral Daily PRN Lindley Stachnik T, MD      . methocarbamol (ROBAXIN) tablet 500 mg  500 mg Oral TID Amiree No, Madie Reno, MD   500 mg at 01/27/19 1434  . pantoprazole (PROTONIX) EC tablet 40 mg  40 mg Oral Daily Gadge Hermiz, Madie Reno, MD   40 mg at 01/27/19 1308  . tamsulosin (FLOMAX) capsule 0.4 mg  0.4 mg Oral Daily Palyn Scrima, Madie Reno, MD   0.4 mg at 01/27/19 6578  . timolol (TIMOPTIC) 0.5 % ophthalmic solution 1 drop  1 drop Both Eyes BID Tramon Crescenzo, Madie Reno, MD   1 drop at 01/27/19 [phone]  . traZODone (DESYREL) tablet 100 mg  100 mg Oral QHS Kaimen Peine, Madie Reno, MD        Lab Results:  Results for orders placed or performed during the hospital encounter of 01/24/19 (from the past 48 hour(s))  Basic metabolic panel     Status: Abnormal   Collection Time: 01/26/19  6:36 AM  Result Value Ref Range   Sodium 137 135 - 145 mmol/L   Potassium 4.5 3.5 - 5.1 mmol/L   Chloride 99 98 - 111 mmol/L   CO2 34 (H) 22 - 32 mmol/L   Glucose, Bld 101 (H) 70 - 99 mg/dL   BUN 15 8 - 23 mg/dL   Creatinine, Ser 0.96 0.61 - 1.24 mg/dL   Calcium 9.3 8.9 - 10.3 mg/dL   GFR calc non Af Amer >60 >60 mL/min   GFR calc Af Amer >60 >60 mL/min   Anion gap 4 (L) 5 - 15    Comment: Performed at [facility], [address] [place], [place] [address]  Lipid panel     Status: Abnormal   Collection Time: 01/26/19  6:36 AM  Result Value Ref Range   Cholesterol 130 0 - 200 mg/dL   Triglycerides 94 <150 mg/dL   HDL 37 (L) >40 mg/dL   Total CHOL/HDL Ratio 3.5 RATIO   VLDL 19 0 - 40 mg/dL   LDL Cholesterol 74 0 - 99 mg/dL    Comment:        Total Cholesterol/HDL:CHD Risk Coronary Heart Disease  Risk Table                     Men   Women  1/2 Average Risk   3.4   3.3  Average Risk       5.0   4.4  2 X Average Risk   9.6   7.1  3 X Average Risk  23.4   11.0        Use the calculated Patient Ratio above and the CHD Risk Table to determine the patient's CHD Risk.        ATP III CLASSIFICATION (LDL):  <100     mg/dL   Optimal  100-129  mg/dL   Near or Above                    Optimal  130-159  mg/dL   Borderline  160-189  mg/dL   High  >190     mg/dL   Very High Performed at [facility], [address] [place], [place] [address]   Hemoglobin A1c     Status: None   Collection Time: 01/26/19  6:36 AM  Result Value Ref Range   Hgb A1c MFr Bld 5.5 4.8 - 5.6 %    Comment: (NOTE) Pre diabetes:          5.7%-6.4% Diabetes:              >6.4% Glycemic control for   <7.0% adults with diabetes    Mean Plasma Glucose 111.15 mg/dL    Comment: Performed at [facility] [address] [place], [place] [address]    Blood Alcohol level:  Lab Results  Component Value Date   ETH <10 19/14/7829    Metabolic Disorder Labs: Lab Results  Component Value Date   HGBA1C 5.5 01/26/2019   MPG 111.15 01/26/2019   No results found for: PROLACTIN Lab Results  Component Value Date   CHOL 130 01/26/2019   TRIG 94 01/26/2019   HDL 37 (L) 01/26/2019   CHOLHDL 3.5 01/26/2019   VLDL 19 01/26/2019   LDLCALC 74 01/26/2019    Physical Findings: AIMS:  , ,  ,  ,    CIWA:    COWS:     Musculoskeletal: Strength & Muscle Tone: within normal limits Gait & Station: normal Patient leans: N/A  Psychiatric Specialty Exam: Physical Exam  Nursing note and vitals reviewed. Constitutional: He appears well-developed and well-nourished.  HENT:  Head: Normocephalic and atraumatic.  Eyes: Pupils are equal, round, and reactive to light. Conjunctivae are normal.  Neck: Normal range of motion.  Cardiovascular: Regular rhythm and normal heart sounds.  Respiratory: Effort  normal.  GI: Soft.  Musculoskeletal: Normal range of motion.  Neurological: He is alert.  Skin: Skin is warm and dry.  Psychiatric: Judgment normal. His affect is blunt. His speech is delayed. He is slowed. He expresses no suicidal ideation. He exhibits abnormal recent memory.    Review of Systems  Constitutional: Negative.   HENT: Positive for tinnitus.   Eyes: Negative.   Respiratory: Negative.   Cardiovascular: Negative.   Gastrointestinal: Negative.   Musculoskeletal: Negative.   Skin: Negative.   Neurological: Negative.   Psychiatric/Behavioral: Positive for depression. Negative for hallucinations, memory loss, substance abuse and suicidal ideas. The patient is nervous/anxious and has insomnia.     Blood pressure 113/72, pulse 70, temperature 97.6 F (36.4 C), temperature source Oral, resp. rate 18, height 5\' 9"  (1.753 m), weight  89.4 kg, SpO2 98 %.Body mass index is 29.09 kg/m.  General Appearance: Casual  Eye Contact:  Fair  Speech:  Slow  Volume:  Decreased  Mood:  Anxious and Depressed  Affect:  Congruent  Thought Process:  Coherent  Orientation:  Full (Time, Place, and Person)  Thought Content:  Logical and Rumination  Suicidal Thoughts:  No  Homicidal Thoughts:  No  Memory:  Immediate;   Fair Recent;   Fair Remote;   Fair  Judgement:  Fair  Insight:  Shallow  Psychomotor Activity:  Decreased  Concentration:  Concentration: Fair  Recall:  [employer] of Knowledge:  Fair  Language:  Fair  Akathisia:  No  Handed:  Right  AIMS (if indicated):     Assets:  Desire for Improvement Housing Resilience Social Support  ADL's:  Impaired  Cognition:  Impaired,  Mild  Sleep:  Number of Hours: 5.75     Treatment Plan Summary: Daily contact with patient to assess and evaluate symptoms and progress in treatment, Medication management and Plan Reviewed with the patient the current medication plan.  He was started on Abilify low dose over the weekend.  Clonazepam was  cut back by a little bit.  He is very anxious right now and I proposed to him that probably going back up a little higher on the clonazepam would be helpful to 1/4 mg in the morning and 1/2 mg at bedtime.  ECT has not been helpful for him in the past and will not be pursued.  Supportive counseling and therapy and review the plan of getting more information from neurosurgery before making a decision.  Possible discharge in 1 to 2 days  Alethia Berthold, MD 01/27/2019, 4:56 PM

## 2019-01-27 NOTE — [facility] Group Notes (Signed)
[facility] Group Notes:  (Nursing/MHT/Case Management/Adjunct)  Date:  01/27/2019  Time:  3:07 PM  Type of Therapy:  Psychoeducational Skills  Participation Level:  Did Not Attend  Tru Leopard 01/27/2019, 3:07 PM

## 2019-01-27 NOTE — Consult Note (Signed)
Neurosurgery-New Consultation Evaluation 01/27/2019 Ruben Duncan [id number]  Identifying Statement: Ruben Duncan is a 73 y.o. male from [place] [address] with tinnitus  Physician Requesting Consultation: Clapacs, Madie Reno, MD  History of Present Illness: Ruben Duncan is here for evaluation of tinnitus causing severe mood disorder. He states the ringing in his head starting about a month ago and was sudden. It is primarily on the right side and he denies any hearing loss. He did not associate any headache, vision changes, or other symptoms with the ringing. He did see [facility] and they did hearing exam and found no abnormalities. The ringing continued and is now unbearable to him.   He denies any difficulty chewing or swallowing. He does have reflux. He denies any ear or throat pain. He has had some light-headed feelings when up moving but states he is eating and drinking normally. He had a MRI of the brain which identified a skull base lesion.  Past Medical History:  Depression GERD HTN BPH   Social History: Social History   Socioeconomic History  . Marital status: Married    Spouse name: Not on file  . Number of children: Not on file  . Years of education: Not on file  . Highest education level: Not on file  Occupational History  . Not on file  Social Needs  . Financial resource strain: Not on file  . Food insecurity:    Worry: Not on file    Inability: Not on file  . Transportation needs:    Medical: Not on file    Non-medical: Not on file  Tobacco Use  . Smoking status: Never Smoker  . Smokeless tobacco: Never Used  Substance and Sexual Activity  . Alcohol use: Not on file  . Drug use: Not on file  . Sexual activity: Not on file  Lifestyle  . Physical activity:    Days per week: Not on file    Minutes per session: Not on file  . Stress: Not on file  Relationships  . Social connections:    Talks on phone: Not on file    Gets together: Not on file    Attends  religious service: Not on file    Active member of club or organization: Not on file    Attends meetings of clubs or organizations: Not on file    Relationship status: Not on file  . Intimate partner violence:    Fear of current or ex partner: Not on file    Emotionally abused: Not on file    Physically abused: Not on file    Forced sexual activity: Not on file  Other Topics Concern  . Not on file  Social History Narrative  . Not on file    Family History: History reviewed. No pertinent family history.  Review of Systems:  Review of Systems - General ROS: Negative Psychological ROS: Negative Ophthalmic ROS: Negative for vision changes ENT ROS: Positive for ringing in head, no hearing changes Hematological and Lymphatic ROS: Negative  Endocrine ROS: Negative Respiratory ROS: Negative Cardiovascular ROS: Negative Gastrointestinal ROS: Negative Genito-Urinary ROS: Negative Musculoskeletal ROS: Negative Neurological ROS: Negative for headache Dermatological ROS: Negative  Physical Exam: BP 113/72 (BP Location: Left Arm)   Pulse 70   Temp 97.6 F (36.4 C) (Oral)   Resp 18   Ht 5\' 9"  (1.753 m)   Wt 89.4 kg   SpO2 98%   BMI 29.09 kg/m  Body mass index is 29.09 kg/m. Body surface  area is 2.09 meters squared. General appearance: Alert, cooperative, appears fatigued Head: Normocephalic, atraumatic Eyes: Normal, EOM intact Oropharynx: Moist without lesions Ext: No edema in LE bilaterally, warm extremities  Neurologic exam:  Mental status: alertness: alert, orientation: person, place, time, affect: normal Speech: fluent and clear, naming and repetition intact Cranial nerves:  II: Visual fields are full by confrontation, no ptosis III/IV/VI: extra-ocular motions intact bilaterally V/VII:no evidence of facial droop or weakness  VIII: hearing normal XI: trapezius strength symmetric XII: tongue strength symmetric  Motor:strength symmetric 5/5, normal muscle mass and tone  in all extremities and no pronator drift Sensory: intact to light touch in all extremities Gait: normal   Laboratory: Results for orders placed or performed during the hospital encounter of 76/19/50  Basic metabolic panel  Result Value Ref Range   Sodium 137 135 - 145 mmol/L   Potassium 4.5 3.5 - 5.1 mmol/L   Chloride 99 98 - 111 mmol/L   CO2 34 (H) 22 - 32 mmol/L   Glucose, Bld 101 (H) 70 - 99 mg/dL   BUN 15 8 - 23 mg/dL   Creatinine, Ser 0.96 0.61 - 1.24 mg/dL   Calcium 9.3 8.9 - 10.3 mg/dL   GFR calc non Af Amer >60 >60 mL/min   GFR calc Af Amer >60 >60 mL/min   Anion gap 4 (L) 5 - 15  Lipid panel  Result Value Ref Range   Cholesterol 130 0 - 200 mg/dL   Triglycerides 94 <150 mg/dL   HDL 37 (L) >40 mg/dL   Total CHOL/HDL Ratio 3.5 RATIO   VLDL 19 0 - 40 mg/dL   LDL Cholesterol 74 0 - 99 mg/dL  Hemoglobin A1c  Result Value Ref Range   Hgb A1c MFr Bld 5.5 4.8 - 5.6 %   Mean Plasma Glucose 111.15 mg/dL   I personally reviewed labs  Imaging: MRI Brain: 1. 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 Recommend imaging follow-up. CT of the skull base may also be useful in evaluating for erosion versus Scalloping. 2. 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 It appears closely related with the jugular vein and this can sometimes cause pulsatile tinnitus however he does state that his symptoms are on the right side. We discussed that this mass is likely benign and biopsy is not safe. I am concerned that his symptoms may not be coming from this mass and cannot guarantee that with treatment they would get better. We discussed options of surveillance, radiation, and surgery. The surgery for this carries risks  and I will facilitate referral to our skull based specialist at [facility] to discuss this with him but currently he needs to continue treatment for his depression in hopes that he can be discharged and see him as outpatient.    1.  Diagnosis: Left skull based mass  2.  Plan - No intervention needed for this mass at this time, will have him scheduled to see Dr. Francesca Oman as outpatient for discussion of treatment.

## 2019-01-27 NOTE — B[facility] Group Notes (Signed)
LCSW Group Therapy Note   01/27/2019 2:02 PM   Type of Therapy and Topic:  Group Therapy:  Overcoming Obstacles   Participation Level:  Minimal   Description of Group:    In this group patients will be encouraged to explore what they see as obstacles to their own wellness and recovery. They will be guided to discuss their thoughts, feelings, and behaviors related to these obstacles. The group will process together ways to cope with barriers, with attention given to specific choices patients can make. Each patient will be challenged to identify changes they are motivated to make in order to overcome their obstacles. This group will be process-oriented, with patients participating in exploration of their own experiences as well as giving and receiving support and challenge from other group members.   Therapeutic Goals: 1. Patient will identify personal and current obstacles as they relate to admission. 2. Patient will identify barriers that currently interfere with their wellness or overcoming obstacles.  3. Patient will identify feelings, thought process and behaviors related to these barriers. 4. Patient will identify two changes they are willing to make to overcome these obstacles:      Summary of Patient Progress Pt came into group late and stayed for about 3 minutes. Pt was engaged in the discussion while he was present in group.     Therapeutic Modalities:   Cognitive Behavioral Therapy Solution Focused Therapy Motivational Interviewing Relapse Prevention Therapy  Evalina Field, MSW, LCSW Clinical Social Work 01/27/2019 2:02 PM

## 2019-01-28 MED ORDER — TRAZODONE HCL 100 MG PO TABS: 100.0000 mg | ORAL_TABLET | Freq: Every day | ORAL | 1 refills | Status: AC

## 2019-01-28 MED ORDER — METHOCARBAMOL 500 MG PO TABS: 500.0000 mg | ORAL_TABLET | Freq: Three times a day (TID) | ORAL | 1 refills | Status: AC

## 2019-01-28 MED ORDER — PANTOPRAZOLE SODIUM 40 MG PO TBEC: 40.0000 mg | DELAYED_RELEASE_TABLET | Freq: Every day | ORAL | 1 refills | Status: AC

## 2019-01-28 MED ORDER — HYDROCHLOROTHIAZIDE 12.5 MG PO CAPS: 12.5000 mg | ORAL_CAPSULE | Freq: Every day | ORAL | 1 refills | Status: AC

## 2019-01-28 MED ORDER — CLONAZEPAM 0.5 MG PO TABS: 0.5000 mg | ORAL_TABLET | Freq: Every day | ORAL | 1 refills | Status: AC

## 2019-01-28 MED ORDER — ARIPIPRAZOLE 5 MG PO TABS: 5.0000 mg | ORAL_TABLET | Freq: Every day | ORAL | 1 refills | Status: DC

## 2019-01-28 MED ORDER — DULOXETINE HCL 60 MG PO CPEP: 60.0000 mg | ORAL_CAPSULE | Freq: Every day | ORAL | 1 refills | Status: AC

## 2019-01-28 NOTE — Plan of Care (Signed)
Patient up and present in the milieu. Affect is flat and depressed. Patient was guarded when communicating with staff. Rates his back pain a 6. Reports that he slept fair last night without the use of an sleep aid. Rates her depression a 5, hopelessness a 4 and anxiety a 5. His goal today is to better communicate with staff and participate more. His complaint today is to "Just get back to my car." Milieu remains safe with q 15 minute safety.

## 2019-01-28 NOTE — Progress Notes (Signed)
Pt denies SI/HI/AVH. Pt given prn pain med at 2044pm for kower back pain 6/10. Effectiveness noted. Marland Kitchen

## 2019-01-28 NOTE — Progress Notes (Signed)
[facility] MD Progress Note  01/28/2019 4:18 PM MICAH BARNIER  MRN:  [id number] Subjective: Patient seen chart reviewed.  Patient says that while he is still feeling depressed he feels better than when he came to the hospital.  He denies any current suicidal thinking.  He is not having any psychotic symptoms.  His tinnitus continues to bother him but he is able to concentrate enough to read and have a conversation and participated in some groups.  Patient of course remains very fixated on the finding of the small possible tumor on the MRI scan. Principal Problem: Severe recurrent major depression without psychotic features ([place]) Diagnosis: Principal Problem:   Severe recurrent major depression without psychotic features ([place]) Active Problems:   GERD (gastroesophageal reflux disease)   Tinnitus   Generalized anxiety disorder  Total Time spent with patient: 30 minutes  Past Psychiatric History: Patient has a history of recurrent episodes of depression.  Prior good response to Cymbalta.  No evidence of bipolar disorder.  He does give the impression of probably having a fair bit of obsessive-compulsive disorder-like pathology  Past Medical History: History reviewed. No pertinent past medical history. History reviewed. No pertinent surgical history. Family History: History reviewed. No pertinent family history. Family Psychiatric  History: None noted Social History:  Social History   Substance and Sexual Activity  Alcohol Use Not on file     Social History   Substance and Sexual Activity  Drug Use Not on file    Social History   Socioeconomic History  . Marital status: Married    Spouse name: Not on file  . Number of children: Not on file  . Years of education: Not on file  . Highest education level: Not on file  Occupational History  . Not on file  Social Needs  . Financial resource strain: Not on file  . Food insecurity:    Worry: Not on file    Inability: Not on file  .  Transportation needs:    Medical: Not on file    Non-medical: Not on file  Tobacco Use  . Smoking status: Never Smoker  . Smokeless tobacco: Never Used  Substance and Sexual Activity  . Alcohol use: Not on file  . Drug use: Not on file  . Sexual activity: Not on file  Lifestyle  . Physical activity:    Days per week: Not on file    Minutes per session: Not on file  . Stress: Not on file  Relationships  . Social connections:    Talks on phone: Not on file    Gets together: Not on file    Attends religious service: Not on file    Active member of club or organization: Not on file    Attends meetings of clubs or organizations: Not on file    Relationship status: Not on file  Other Topics Concern  . Not on file  Social History Narrative  . Not on file   Additional Social History:                         Sleep: Fair  Appetite:  Fair  Current Medications: Current Facility-Administered Medications  Medication Dose Route Frequency Provider Last Rate Last Dose  . acetaminophen (TYLENOL) tablet 650 mg  650 mg Oral Q6H PRN , Madie Reno, MD   650 mg at 01/26/19 1617  . alum & mag hydroxide-simeth (MAALOX/MYLANTA) 200-200-20 MG/5ML suspension 30 mL  30 mL Oral Q4H  PRN , Madie Reno, MD      . ARIPiprazole (ABILIFY) tablet 5 mg  5 mg Oral Daily Chauncey Mann, MD   5 mg at 01/28/19 0811  . clonazePAM (KLONOPIN) tablet 0.25 mg  0.25 mg Oral QPC supper Chauncey Mann, MD   0.25 mg at 01/27/19 1713  . clonazePAM (KLONOPIN) tablet 0.5 mg  0.5 mg Oral QHS ,  T, MD   0.5 mg at 01/27/19 2132  . DULoxetine (CYMBALTA) DR capsule 60 mg  60 mg Oral Daily , Madie Reno, MD   60 mg at 01/28/19 0811  . gabapentin (NEURONTIN) capsule 300 mg  300 mg Oral TID , Madie Reno, MD   300 mg at 01/28/19 1207  . hydrochlorothiazide (MICROZIDE) capsule 12.5 mg  12.5 mg Oral Daily ,  T, MD   12.5 mg at 01/28/19 0811  . hydrOXYzine (ATARAX/VISTARIL) tablet 25 mg  25 mg  Oral TID PRN Chauncey Mann, MD   25 mg at 01/27/19 1222  . ibuprofen (ADVIL,MOTRIN) tablet 800 mg  800 mg Oral Q8H PRN Lavella Hammock, MD   800 mg at 01/28/19 1416  . magnesium hydroxide (MILK OF MAGNESIA) suspension 30 mL  30 mL Oral Daily PRN ,  T, MD      . methocarbamol (ROBAXIN) tablet 500 mg  500 mg Oral TID , Madie Reno, MD   500 mg at 01/28/19 1207  . pantoprazole (PROTONIX) EC tablet 40 mg  40 mg Oral Daily , Madie Reno, MD   40 mg at 01/28/19 0811  . tamsulosin (FLOMAX) capsule 0.4 mg  0.4 mg Oral Daily , Madie Reno, MD   0.4 mg at 01/28/19 0811  . timolol (TIMOPTIC) 0.5 % ophthalmic solution 1 drop  1 drop Both Eyes BID , Madie Reno, MD   1 drop at 01/28/19 0811  . traZODone (DESYREL) tablet 100 mg  100 mg Oral QHS ,  T, MD   100 mg at 01/27/19 2132    Lab Results: No results found for this or any previous visit (from the past 48 hour(s)).  Blood Alcohol level:  Lab Results  Component Value Date   ETH <10 17/61/6073    Metabolic Disorder Labs: Lab Results  Component Value Date   HGBA1C 5.5 01/26/2019   MPG 111.15 01/26/2019   No results found for: PROLACTIN Lab Results  Component Value Date   CHOL 130 01/26/2019   TRIG 94 01/26/2019   HDL 37 (L) 01/26/2019   CHOLHDL 3.5 01/26/2019   VLDL 19 01/26/2019   LDLCALC 74 01/26/2019    Physical Findings: AIMS:  , ,  ,  ,    CIWA:    COWS:     Musculoskeletal: Strength & Muscle Tone: within normal limits Gait & Station: normal Patient leans: N/A  Psychiatric Specialty Exam: Physical Exam  Nursing note and vitals reviewed. Constitutional: He appears well-developed and well-nourished.  HENT:  Head: Normocephalic and atraumatic.  Eyes: Pupils are equal, round, and reactive to light. Conjunctivae are normal.  Neck: Normal range of motion.  Cardiovascular: Normal heart sounds.  Respiratory: Effort normal.  GI: Soft.  Musculoskeletal: Normal range of motion.  Neurological: He  is alert.  Skin: Skin is warm and dry.  Psychiatric: His speech is normal and behavior is normal. Judgment and thought content normal. His mood appears anxious. Cognition and memory are normal.    Review of Systems  Constitutional: Negative.   HENT: Positive for tinnitus. Negative for ear  pain and hearing loss.   Eyes: Negative.   Respiratory: Negative.   Cardiovascular: Negative.   Gastrointestinal: Negative.   Musculoskeletal: Negative.   Skin: Negative.   Neurological: Negative.   Psychiatric/Behavioral: Positive for depression. Negative for hallucinations, memory loss, substance abuse and suicidal ideas. The patient is nervous/anxious. The patient does not have insomnia.     Blood pressure 100/76, pulse 82, temperature 98.2 F (36.8 C), temperature source Oral, resp. rate 18, height 5\' 9"  (1.753 m), weight 89.4 kg, SpO2 98 %.Body mass index is 29.09 kg/m.  General Appearance: Casual  Eye Contact:  Fair  Speech:  Clear and Coherent  Volume:  Normal  Mood:  Dysphoric  Affect:  Congruent  Thought Process:  Coherent  Orientation:  Full (Time, Place, and Person)  Thought Content:  Logical  Suicidal Thoughts:  No  Homicidal Thoughts:  No  Memory:  Immediate;   Fair Recent;   Fair Remote;   Fair  Judgement:  Fair  Insight:  Fair  Psychomotor Activity:  Decreased  Concentration:  Concentration: Fair  Recall:  [employer] of Knowledge:  Fair  Language:  Fair  Akathisia:  No  Handed:  Right  AIMS (if indicated):     Assets:  Desire for Improvement Housing Physical Health Social Support  ADL's:  Intact  Cognition:  WNL  Sleep:  Number of Hours: 7     Treatment Plan Summary: Daily contact with patient to assess and evaluate symptoms and progress in treatment, Medication management and Plan 73 year old gentleman with a history of recurrent depression and anxiety.  Currently lucid without psychosis and denies suicidal ideation.  Patient says he feels like he is probably  ready to be discharged home as he does not feel like he is making much "progress" here in the hospital.  From a psychiatric standpoint I think that is probably correct and that he would be just as well served by going to outpatient treatment.  The social worker was going to try and make arrangements to [facility]  He is tolerating his current medicine fine.  As far as the finding on the MRI I spoke to Dr. Lacinda Axon again this evening.  They have made contact with the neurosurgeon at [facility] and an appointment will be made or the patient himself will be contacted about making an appointment to have that looked at.  In the meanwhile there is nothing specific that needs to be done about it.  Alethia Berthold, MD 01/28/2019, 4:18 PM

## 2019-01-28 NOTE — Plan of Care (Signed)
  Problem: Education: Goal: Mental status will improve Outcome: Progressing  Patient alert and oriented x 4 denies SI/HI/AVH

## 2019-01-28 NOTE — BH Assessment (Signed)
[facility] Assessment Progress Note  Jone Baseman, pt's outpatient therapist at [facility] in [place], [place], calls to follow up on this pt in response to message that I left for her on 01/24/2019.  Please note that pt has signed Consent to Release Information to this provider, as well as Dr Reece Levy, and that consent form has been faxed to [facility].  Original was to have been sent along with patient.  Please refer to note that this writer entered on 01/24/2019 at 15:28.  I notified Ms Lorin Mercy that pt has been transferred, and that staff at treating facility are at liberty to discuss pt's treatment needs with her.  Please contact [facility] as needed to arrange for pt's after care.  Jalene Mullet, [place] Coordinator [phone]

## 2019-01-28 NOTE — Progress Notes (Addendum)
I was able to discuss Ruben Duncan case with the scope a Psychologist, sport and exercise at [facility].  He is glad to see him in clinic for evaluation but is concerned that his symptoms do appear to be contralateral to the location of the lesion.  There is no emergent intervention needed but we will place the referral with Dr. Augusto Garbe and his office will be contacting the patient for an appointment.

## 2019-01-28 NOTE — Progress Notes (Signed)
Recreation Therapy Notes  INPATIENT RECREATION THERAPY ASSESSMENT  Patient Details Name: Ruben Duncan MRN: [id number] DOB: 28-Feb-1946 Today's Date: 01/28/2019       Information Obtained From: Chart Review  Able to Participate in Assessment/Interview:    Patient Presentation:    Reason for Admission (Per Patient): Active Symptoms, Suicidal Ideation  Patient Stressors:    Coping Skills:   Talk  Leisure Interests (2+):  (canoeing)  Frequency of Recreation/Participation: Monthly  Awareness of Community Resources:     [employer]:     Current Use:    If no, Barriers?:    Expressed Interest in [employer] Information:    [place] of Residence:  Advertising copywriter  Patient Main Form of Transportation: Musician  Patient Strengths:  Empathy, communication  Patient Identified Areas of Improvement:  N/A  Patient Goal for Hospitalization:  N/A  Current SI (including self-harm):  No  Current HI:  No  Current AVH: No  Staff Intervention Plan: Group Attendance, Collaborate with Interdisciplinary Treatment Team  Consent to Intern Participation: N/A  Aliani Caccavale 01/28/2019, 11:55 AM

## 2019-01-28 NOTE — Progress Notes (Signed)
Recreation Therapy Notes  Date: 01/28/2019  Time: 9:30 am  Location: Craft Room  Behavioral response: Appropriate  Intervention Topic: Problem Solving  Discussion/Intervention:  Group content on today was focused on problem solving. The group described what problem solving is. Patients expressed how problems affect them and how they deal with problems. Individuals identified healthy ways to deal with problems. Patients explained what normally happens to them when they do not deal with problems. The group expressed reoccurring problems for them. The group participated in the intervention "Ways to Solve problems" where patients were given a chance to explore different ways to solve problems.  Clinical Observations/Feedback:  Patient came to group and explained that he solves his problems by talking to people that he is close to. He explained that if problems are not solved the just circle and come back around. Individual was social with peers and staff while participating in the intervention.  Sherea Liptak LRT/CTRS         Velna Hedgecock 01/28/2019 10:50 AM

## 2019-01-28 NOTE — BHH Group Notes (Signed)
[facility] LCSW Group Therapy Note  Date/Time: 01/28/19, 1300  Type of Therapy/Topic:  Group Therapy:  Feelings about Diagnosis  Participation Level:  Active   Mood:pleasant   Description of Group:    This group will allow patients to explore their thoughts and feelings about diagnoses they have received. Patients will be guided to explore their level of understanding and acceptance of these diagnoses. Facilitator will encourage patients to process their thoughts and feelings about the reactions of others to their diagnosis, and will guide patients in identifying ways to discuss their diagnosis with significant others in their lives. This group will be process-oriented, with patients participating in exploration of their own experiences as well as giving and receiving support and challenge from other group members.   Therapeutic Goals: 1. Patient will demonstrate understanding of diagnosis as evidence by identifying two or more symptoms of the disorder:  2. Patient will be able to express two feelings regarding the diagnosis 3. Patient will demonstrate ability to communicate their needs through discussion and/or role plays  Summary of Patient Progress:Good participation.  Pt was attentive throughout as well.  Pt shared that he does agree with his depression diagnosis.  Pt particularly made some genuine comments around the topic of hopelessness.        Therapeutic Modalities:   Cognitive Behavioral Therapy Brief Therapy Feelings Identification   Lurline Idol, LCSW

## 2019-01-29 MED ORDER — GABAPENTIN 300 MG PO CAPS: 300.0000 mg | ORAL_CAPSULE | Freq: Three times a day (TID) | ORAL | 1 refills | Status: AC

## 2019-01-29 MED ORDER — TIMOLOL MALEATE 0.5 % OP SOLN: 1.0000 [drp] | Freq: Two times a day (BID) | OPHTHALMIC | 1 refills | Status: AC

## 2019-01-29 MED ORDER — HYDROXYZINE HCL 25 MG PO TABS: 25.0000 mg | ORAL_TABLET | Freq: Three times a day (TID) | ORAL | 0 refills | Status: AC | PRN

## 2019-01-29 MED ORDER — TAMSULOSIN HCL 0.4 MG PO CAPS: 0.4000 mg | ORAL_CAPSULE | Freq: Every day | ORAL | 1 refills | Status: AC

## 2019-01-29 NOTE — Progress Notes (Signed)
Recreation Therapy Notes  INPATIENT RECREATION TR PLAN  Patient Details Name: Ruben Duncan MRN: [id number] DOB: [birth year] Today's Date: 01/29/2019  Rec Therapy Plan Is patient appropriate for Therapeutic Recreation?: Yes Treatment times per week: at least 3 Estimated Length of Stay: 5-7 days TR Treatment/Interventions: Group participation (Comment)  Discharge Criteria Pt will be discharged from therapy if:: Discharged Treatment plan/goals/alternatives discussed and agreed upon by:: Patient/family  Discharge Summary Short term goals set:  Patient will engage in groups without prompting or encouragement from LRT x3 group sessions within 5 recreation therapy group sessions Short term goals met: Complete Progress toward goals comments: Groups attended Which groups?: Communication, Other (Comment)(Problem Solving) Reason goals not met: N/A Therapeutic equipment acquired: N/A Reason patient discharged from therapy: Discharge from hospital Pt/family agrees with progress & goals achieved: Yes Date patient discharged from therapy: 01/29/19   Levin Dagostino 01/29/2019, 11:49 AM

## 2019-01-29 NOTE — Progress Notes (Signed)
Patient alert and oriented x 4, denies SI/HI/AVH no distress noted, affect is flat he is interacting appropriately with peers and staff, thoughts are organized and coherent,and he is complaint with medications. 15 minutes safety checks maintained will continue to monitor.

## 2019-01-29 NOTE — Discharge Summary (Signed)
Physician Discharge Summary Note  Patient:  Ruben Duncan is an 73 y.o., male MRN:  [id number] DOB:  Sep 08, 1946 Patient phone:  [phone] (home)  Patient address:   [place] [id number],  Total Time spent with patient: 45 minutes  Date of Admission:  01/24/2019 Date of Discharge: January 29, 2019  Reason for Admission: Admitted in transfer from [facility] because of major depression associated with tinnitus and recent increase in suicidal ideation.  Principal Problem: Severe recurrent major depression without psychotic features [facility]) Discharge Diagnoses: Principal Problem:   Severe recurrent major depression without psychotic features ([place]) Active Problems:   GERD (gastroesophageal reflux disease)   Tinnitus   Generalized anxiety disorder   Past Psychiatric History: Patient has a past history of recurrent depression with partial response to medication in the past but recent worsening and difficulty tolerating medication changes  Past Medical History: History reviewed. No pertinent past medical history. History reviewed. No pertinent surgical history. Family History: History reviewed. No pertinent family history. Family Psychiatric  History: None Social History:  Social History   Substance and Sexual Activity  Alcohol Use Not on file     Social History   Substance and Sexual Activity  Drug Use Not on file    Social History   Socioeconomic History  . Marital status: Married    Spouse name: Not on file  . Number of children: Not on file  . Years of education: Not on file  . Highest education level: Not on file  Occupational History  . Not on file  Social Needs  . Financial resource strain: Not on file  . Food insecurity:    Worry: Not on file    Inability: Not on file  . Transportation needs:    Medical: Not on file    Non-medical: Not on file  Tobacco Use  . Smoking status: Never Smoker  . Smokeless tobacco: Never Used  Substance  and Sexual Activity  . Alcohol use: Not on file  . Drug use: Not on file  . Sexual activity: Not on file  Lifestyle  . Physical activity:    Days per week: Not on file    Minutes per session: Not on file  . Stress: Not on file  Relationships  . Social connections:    Talks on phone: Not on file    Gets together: Not on file    Attends religious service: Not on file    Active member of club or organization: Not on file    Attends meetings of clubs or organizations: Not on file    Relationship status: Not on file  Other Topics Concern  . Not on file  Social History Narrative  . Not on file    Hospital Course: Patient admitted to the psychiatric ward.  Voluntary.  15-minute checks.  Engaged in individual and group therapy.  Medication changes were made specifically changing him back to Cymbalta which has been well-tolerated in the past.  On admission an MRI scan was ordered by the on-call physician and found a small approximately 1 cm diameter lesion near the left jugular.  Unclear significance.  Neurosurgical consult was obtained.  Very unclear whether this has anything to do with any of his symptoms.  Nothing we can do about it here in our hospital.  Patient is going to follow-up with a skull surgeon at [facility].  Meanwhile patient's mood has improved and he denies suicidal ideation.  Patient is requesting discharge and is agreeable  to follow-up in the community with [facility].  Physical Findings: AIMS:  , ,  ,  ,    CIWA:    COWS:     Musculoskeletal: Strength & Muscle Tone: within normal limits Gait & Station: normal Patient leans: N/A  Psychiatric Specialty Exam: Physical Exam  Nursing note and vitals reviewed. Constitutional: He appears well-developed and well-nourished.  HENT:  Head: Normocephalic and atraumatic.  Eyes: Pupils are equal, round, and reactive to light. Conjunctivae are normal.  Neck: Normal range of motion.  Cardiovascular: Regular rhythm and normal  heart sounds.  Respiratory: Effort normal. No respiratory distress.  GI: Soft.  Musculoskeletal: Normal range of motion.  Neurological: He is alert.  Skin: Skin is warm and dry.  Psychiatric: Judgment normal. His affect is blunt. His speech is delayed. He is slowed. Thought content is not paranoid. Cognition and memory are normal. He expresses no homicidal and no suicidal ideation.    Review of Systems  Constitutional: Negative.   HENT: Positive for tinnitus.   Eyes: Negative.   Respiratory: Negative.   Cardiovascular: Negative.   Gastrointestinal: Negative.   Musculoskeletal: Negative.   Skin: Negative.   Neurological: Negative.   Psychiatric/Behavioral: Positive for depression. Negative for hallucinations, memory loss, substance abuse and suicidal ideas. The patient is not nervous/anxious and does not have insomnia.     Blood pressure 129/80, pulse 72, temperature 97.9 F (36.6 C), temperature source Oral, resp. rate 18, height 5\' 9"  (1.753 m), weight 89.4 kg, SpO2 98 %.Body mass index is 29.09 kg/m.  General Appearance: Casual  Eye Contact:  Good  Speech:  Clear and Coherent  Volume:  Normal  Mood:  Dysphoric  Affect:  Constricted  Thought Process:  Coherent  Orientation:  Full (Time, Place, and Person)  Thought Content:  Logical  Suicidal Thoughts:  No  Homicidal Thoughts:  No  Memory:  Immediate;   Fair Recent;   Fair Remote;   Fair  Judgement:  Fair  Insight:  Fair  Psychomotor Activity:  Normal  Concentration:  Concentration: Fair  Recall:  [place] of Knowledge:  Fair  Language:  Fair  Akathisia:  No  Handed:  Right  AIMS (if indicated):     Assets:  Communication Skills Desire for Improvement Housing Resilience Social Support  ADL's:  Intact  Cognition:  WNL  Sleep:  Number of Hours: 7.45     Have you used any form of tobacco in the last 30 days? (Cigarettes, Smokeless Tobacco, Cigars, and/or Pipes): No  Has this patient used any form of tobacco  in the last 30 days? (Cigarettes, Smokeless Tobacco, Cigars, and/or Pipes) Yes, No  Blood Alcohol level:  Lab Results  Component Value Date   ETH <10 62/37/6283    Metabolic Disorder Labs:  Lab Results  Component Value Date   HGBA1C 5.5 01/26/2019   MPG 111.15 01/26/2019   No results found for: PROLACTIN Lab Results  Component Value Date   CHOL 130 01/26/2019   TRIG 94 01/26/2019   HDL 37 (L) 01/26/2019   CHOLHDL 3.5 01/26/2019   VLDL 19 01/26/2019   [place] 74 01/26/2019    See Psychiatric Specialty Exam and Suicide Risk Assessment completed by Attending Physician prior to discharge.  Discharge destination:  Home  Is patient on multiple antipsychotic therapies at discharge:  No   Has Patient had three or more failed trials of antipsychotic monotherapy by history:  No  Recommended Plan for Multiple Antipsychotic Therapies: NA  Discharge  Instructions    Diet - low sodium heart healthy   Complete by:  As directed    Increase activity slowly   Complete by:  As directed      Allergies as of 01/29/2019      Reactions   Citalopram    Ringing in the ears and anxiety      Medication List    STOP taking these medications   omeprazole 20 MG capsule Commonly known as:  PRILOSEC Replaced by:  pantoprazole 40 MG tablet   zolpidem 10 MG tablet Commonly known as:  AMBIEN     TAKE these medications     Indication  ARIPiprazole 5 MG tablet Commonly known as:  ABILIFY Take 1 tablet (5 mg total) by mouth daily.  Indication:  Major Depressive Disorder   B-complex with vitamin C tablet Take 1 tablet by mouth daily.  Indication:  Vitamin Deficiency   clonazePAM 0.5 MG tablet Commonly known as:  KLONOPIN Take 1 tablet (0.5 mg total) by mouth at bedtime.  Indication:  Panic Disorder   DULoxetine 60 MG capsule Commonly known as:  CYMBALTA Take 1 capsule (60 mg total) by mouth daily.  Indication:  Major Depressive Disorder   gabapentin 300 MG capsule Commonly  known as:  NEURONTIN Take 1 capsule (300 mg total) by mouth 3 (three) times daily.  Indication:  Fibromyalgia Syndrome   hydrochlorothiazide 12.5 MG capsule Commonly known as:  MICROZIDE Take 1 capsule (12.5 mg total) by mouth daily.  Indication:  High Blood Pressure Disorder   hydrOXYzine 25 MG tablet Commonly known as:  ATARAX/VISTARIL Take 1 tablet (25 mg total) by mouth 3 (three) times daily as needed (anxiety).  Indication:  Feeling Anxious, State of Being Sedated   methocarbamol 500 MG tablet Commonly known as:  ROBAXIN Take 1 tablet (500 mg total) by mouth 3 (three) times daily.  Indication:  Musculoskeletal Pain   pantoprazole 40 MG tablet Commonly known as:  PROTONIX Take 1 tablet (40 mg total) by mouth daily. Replaces:  omeprazole 20 MG capsule  Indication:  Gastroesophageal Reflux Disease   tamsulosin 0.4 MG Caps capsule Commonly known as:  FLOMAX Take 1 capsule (0.4 mg total) by mouth daily.  Indication:  Benign Enlargement of Prostate   timolol 0.5 % ophthalmic solution Commonly known as:  TIMOPTIC Place 1 drop into both eyes 2 (two) times daily.  Indication:  Increased Pressure Within the Eye   traZODone 100 MG tablet Commonly known as:  DESYREL Take 1 tablet (100 mg total) by mouth at bedtime.  Indication:  Trouble Sleeping   Vitamin D3 25 MCG (1000 UT) Caps Take 1,000 mg by mouth daily.  Indication:  Vitamin D Deficiency      [place], [facility]. Go on 01/29/2019.   Specialty:  Behavioral Health Why:  Please attend your psychiatric appointment on 2/25 at 4:00pm with Dr. Reece Levy.  Please attend therapy appointment, Marcene Brawn, on 03/11/2019 at 1pm. Please bring the following to your appointment photo ID, insurance card and list of current meds.  Contact information: [place] [address] [phone]           Follow-up recommendations:  Activity:  Continue activity as  tolerated Diet:  Regular diet Other:  Follow[facility]up with [facility]  Comments: Patient was started on low-dose Abilify in addition to changing his medicine back to Cymbalta.  Mood is slightly improved not actively suicidal not psychotic no longer  meets criteria for inpatient hospitalization.  Psychoeducation and review of treatment plan completed and the patient will be discharged with outpatient follow-up as noted.  Signed: Alethia Berthold, MD 01/29/2019, 11:06 AM

## 2019-01-29 NOTE — Progress Notes (Signed)
Patient ID: Ruben Duncan, male   DOB: [birth year], 73 y.o.   MRN: [id number]  Discharge Note:  Patient denies SI/HI/AVH at this time. Discharge instructions, AVS, prescriptions and transition record gone over with patient. Patient agrees to comply with medication management, follow-up visit, and outpatient therapy.  Patient belongings returned to him.Patient questions and concerns addressed and answered. Patient ambulatory off unit. Patient discharged to [facility] to retrieve his automobile via [employer].

## 2019-01-29 NOTE — BHH Suicide Risk Assessment (Signed)
[facility] Discharge Suicide Risk Assessment   Principal Problem: Severe recurrent major depression without psychotic features [facility]) Discharge Diagnoses: Principal Problem:   Severe recurrent major depression without psychotic features ([place]) Active Problems:   GERD (gastroesophageal reflux disease)   Tinnitus   Generalized anxiety disorder   Total Time spent with patient: 45 minutes  Musculoskeletal: Strength & Muscle Tone: within normal limits Gait & Station: normal Patient leans: N/A  Psychiatric Specialty Exam: Review of Systems  Constitutional: Negative.   HENT: Positive for tinnitus.   Eyes: Negative.   Respiratory: Negative.   Cardiovascular: Negative.   Gastrointestinal: Negative.   Musculoskeletal: Negative.   Skin: Negative.   Neurological: Negative.   Psychiatric/Behavioral: Positive for depression. Negative for hallucinations, memory loss, substance abuse and suicidal ideas. The patient is not nervous/anxious and does not have insomnia.     Blood pressure 129/80, pulse 72, temperature 97.9 F (36.6 C), temperature source Oral, resp. rate 18, height 5\' 9"  (1.753 m), weight 89.4 kg, SpO2 98 %.Body mass index is 29.09 kg/m.  General Appearance: Fairly Groomed  Engineer, water::  Fair  Speech:  Clear and [id number]  Volume:  Normal  Mood:  Dysphoric  Affect:  Constricted  Thought Process:  Goal Directed  Orientation:  Full (Time, Place, and Person)  Thought Content:  Logical  Suicidal Thoughts:  No  Homicidal Thoughts:  No  Memory:  Immediate;   Fair Recent;   Fair Remote;   Fair  Judgement:  Fair  Insight:  Fair  Psychomotor Activity:  Normal  Concentration:  Fair  Recall:  [employer] [place]  Language: Fair  Akathisia:  No  Handed:  Right  AIMS (if indicated):     Assets:  Desire for Improvement Housing Social Support  Sleep:  Number of Hours: 7.45  Cognition: WNL  ADL's:  Intact   Mental Status Per Nursing Assessment::   On Admission:   Suicidal ideation indicated by patient  Demographic Factors:  Male and Age 71 or older  Loss Factors: Decline in physical health  Historical Factors: NA  Risk Reduction Factors:   Sense of responsibility to family, Living with another person, especially a relative, Positive social support and Positive therapeutic relationship  Continued Clinical Symptoms:  Depression:   Anhedonia  Cognitive Features That Contribute To Risk:  Polarized thinking    Suicide Risk:  Minimal: No identifiable suicidal ideation.  Patients presenting with no risk factors but with morbid ruminations; may be classified as minimal risk based on the severity of the depressive symptoms  [place]. Go on 01/29/2019.   Specialty:  Behavioral Health Why:  Please attend your psychiatric appointment on 2/25 at 4:00pm with Dr. Reece Levy.  Please attend therapy appointment, Marcene Brawn, on 03/11/2019 at 1pm. Please bring the following to your appointment photo ID, insurance card and list of current meds.  Contact information: [place] [address] [phone]           Plan Of Care/Follow-up recommendations:  Activity:  Activity as tolerated Diet:  Regular diet Other:  Follow-up with outpatient mental health care.  Make sure contact is made for follow-up with neurosurgery as well  Alethia Berthold, MD 01/29/2019, 11:03 AM

## 2019-01-29 NOTE — Progress Notes (Signed)
  [facility] Adult Case Management Discharge Plan :  Will you be returning to the same living situation after discharge:  Yes,  pt is returning home. At discharge, do you have transportation home?: Yes,  CSW will provide the pt with a cab voucher to [facility] to get his car. Do you have the ability to pay for your medications: Yes,  [employer] Medicare  Release of information consent forms completed and in the chart;  Patient's signature needed at discharge.  Patient to Follow up at: Follow-up [place], [facility]. Go on 01/29/2019.   Specialty:  Behavioral Health Why:  Please attend your psychiatric appointment on 2/25 at 4:00pm with Dr. Reece Levy.  Please attend therapy appointment, Marcene Brawn, on 03/11/2019 at 1pm. Please bring the following to your appointment photo ID, insurance card and list of current meds.  Contact information: [address] [place] [address] [phone]           Next level of care provider has access to [place] and Suicide Prevention discussed: Yes,  SPE completed with the pt wife.  Have you used any form of tobacco in the last 30 days? (Cigarettes, Smokeless Tobacco, Cigars, and/or Pipes): No  Has patient been referred to the Quitline?: N/A patient is not a smoker  Patient has been referred for addiction treatment: [place], LCSW 01/29/2019, 10:08 AM

## 2019-01-29 NOTE — BHH Counselor (Signed)
CSW spoke with the pt's nurse Demetria regarding pt discharge.  CSW informed that CSW paper work was complete and submitted.  CSW was informed that Dr. Weber Cooks has not completed a discharge suicide risk assessment, CSW asked for a follow up call when this has been completed so CSW can call pt a cab.  Assunta Curtis, MSW, LCSW 01/29/2019 10:07 AM

## 2019-01-29 NOTE — Progress Notes (Signed)
Recreation Therapy Notes  Date: 01/29/2019  Time: 9:30 am  Location: Craft Room  Behavioral response: Appropriate  Intervention Topic: Communication   Discussion/Intervention:  Group content today was focused on communication. The group defined communication and ways to communicate with others. Individuals stated reason why communication is important and some reasons to communicate with others. Patients expressed if they thought they were good at communicating with others and ways they could improve their communication skills. The group identified important parts of communication and some experiences they have had in the past with communication. The group participated in the intervention "What is that?", where they had a chance to test out their communication skills and identify ways to improve their communication techniques.  Clinical Observations/Feedback:  Patient came to group late and explained that he communicates mainly with his daughter and son. Individual was social with peers and staff while participating in the intervention.  Ruben Duncan LRT/CTRS       Rayli Wiederhold 01/29/2019 11:46 AM

## 2019-01-29 NOTE — BHH Counselor (Signed)
CSW spoke with the patient's wife and confirmed that the patient's car was at [employer].  Assunta Curtis, MSW, LCSW 01/29/2019 10:08 AM

## 2019-01-29 NOTE — BHH Suicide Risk Assessment (Signed)
[place] INPATIENT:  Family/Significant Other Suicide Prevention Education  Suicide Prevention Education:  Education Completed; Gerber Penza, wife, [phone] has been identified by the patient as the family member/significant other with whom the patient will be residing, and identified as the person(s) who will aid the patient in the event of a mental health crisis (suicidal ideations/suicide attempt).  With written consent from the patient, the family member/significant other has been provided the following suicide prevention education, prior to the and/or following the discharge of the patient.  The suicide prevention education provided includes the following:  Suicide risk factors  Suicide prevention and interventions  National Suicide Hotline telephone number  [facility] assessment telephone number  [facility] Emergency Assistance [place] and/or Residential Mobile Crisis Unit telephone number  Request made of family/significant other to:  Remove weapons (e.g., guns, rifles, knives), all items previously/currently identified as safety concern.    Remove drugs/medications (over-the-counter, prescriptions, illicit drugs), all items previously/currently identified as a safety concern.  The family member/significant other verbalizes understanding of the suicide prevention education information provided.  The family member/significant other agrees to remove the items of safety concern listed above.  CSW notes that note is entered late.  CSW spoke with the pt's wife on 01/27/2019.  Rozann Lesches 01/27/2019, 3:17 PM

## 2019-02-03 ENCOUNTER — Telehealth: Payer: Self-pay

## 2019-02-03 NOTE — Telephone Encounter (Signed)
prior authorization was appoved unitl  01-31-20

## 2019-02-03 NOTE — Telephone Encounter (Signed)
faxed and confirmed the prior authorization approval notice

## 2019-02-03 NOTE — Telephone Encounter (Signed)
notified that a prior auth was needed for the hydroxyzine

## 2019-02-03 NOTE — Telephone Encounter (Signed)
went online to [email or web address] and submitted the prior authiroziation

## 2019-03-18 ENCOUNTER — Other Ambulatory Visit: Payer: Self-pay | Admitting: Psychiatry

## 2019-03-24 ENCOUNTER — Other Ambulatory Visit: Payer: Self-pay | Admitting: Psychiatry

## 2019-04-28 ENCOUNTER — Other Ambulatory Visit: Payer: Self-pay | Admitting: Psychiatry

## 2019-11-29 ENCOUNTER — Other Ambulatory Visit: Payer: Self-pay | Admitting: Psychiatry

## 2020-01-27 ENCOUNTER — Telehealth: Payer: Self-pay

## 2020-01-27 NOTE — Telephone Encounter (Signed)
went online to [email or web address] and submitted the prior auth. it was approved from 12-19-19 to 01-26-2021

## 2020-01-27 NOTE — Telephone Encounter (Signed)
received notice that a prior auth was needed  fpr tje judrpxuzome 25mg 

## 2020-02-15 ENCOUNTER — Ambulatory Visit: Payer: Medicare HMO | Attending: Internal Medicine

## 2020-02-15 DIAGNOSIS — Z23 Encounter for immunization: Secondary | ICD-10-CM

## 2020-02-15 IMAGING — MR MR BRAIN/IAC WO/W
8 of 13 series · 21 of 48 positions shown · IV contrast (Gadavist)
Comparison: None.

CLINICAL DATA: Tinnitus that is not resolving, non pulsatile.

EXAM:
MRI HEAD WITHOUT AND WITH CONTRAST
TECHNIQUE: Multiplanar, multiecho pulse sequences of the brain and surrounding
structures were obtained without and with intravenous contrast.
CONTRAST:  8 cc Gadavist intravenous

[Series 5: T1 · sagittal · 5.0mm · 0.62mm/px · 3 of 25 slices shown (1 of 3)]
[im 1/25]
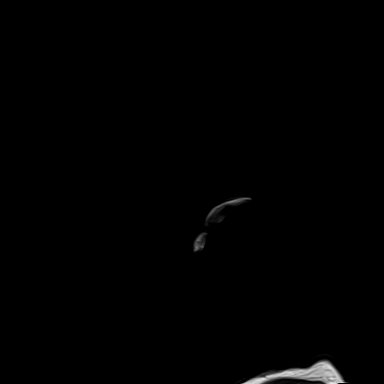
[im 13/25]
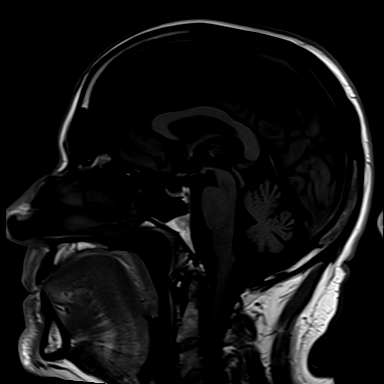
[im 25/25]
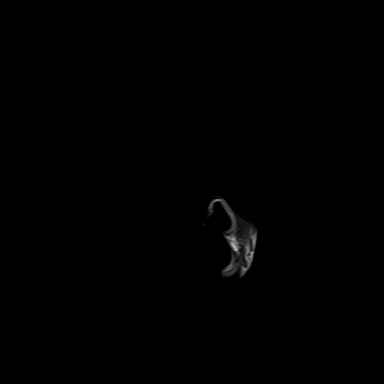

[Series 8: T2 · axial · 5.0mm · 0.45mm/px · z∈[-41,+114]mm · 3 of 27 slices shown]
[im 1/27]
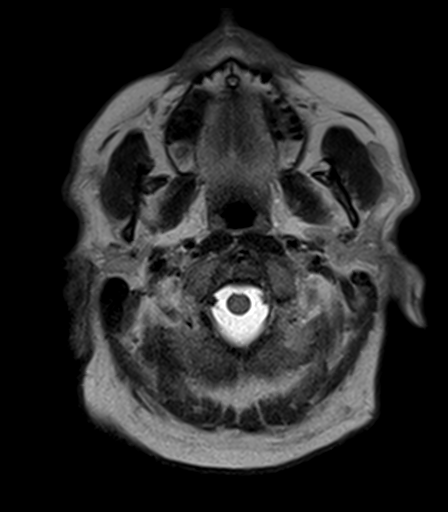
[im 14/27]
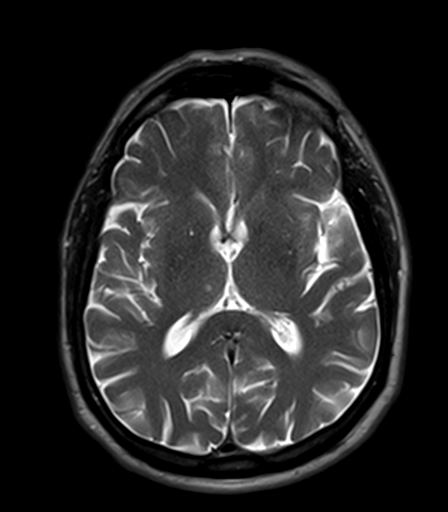
[im 27/27]
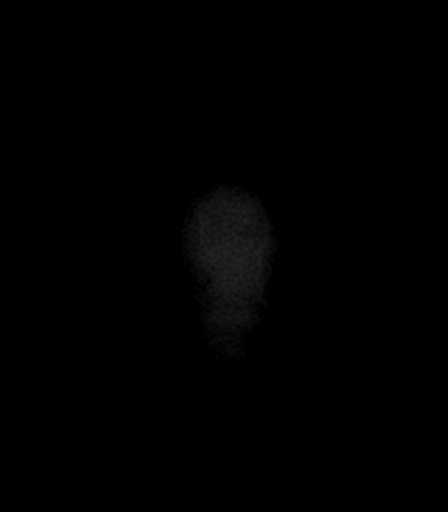

[Series 10: FLAIR · axial · 5.0mm · 1.20mm/px · z∈[-41,+114]mm · 3 of 27 slices shown]
[im 1/27]
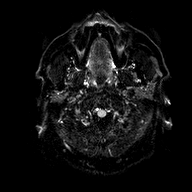
[im 14/27]
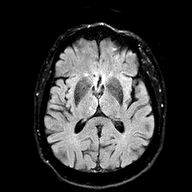
[im 27/27]
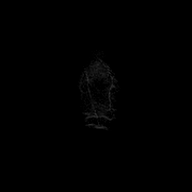

[Series 11: T1 · axial · 5.0mm · 0.90mm/px · z∈[-41,+114]mm · 3 of 27 slices shown (2 of 3)]
[im 1/27]
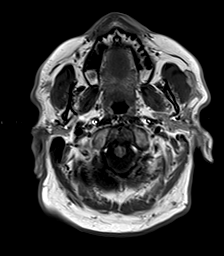
[im 14/27]
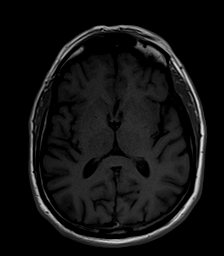
[im 27/27]
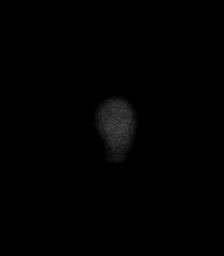

[Series 13: T1 · axial · non-contrast · 3.0mm · 0.21mm/px · z∈[-50,-4]mm · 2 of 15 slices shown (3 of 3)]
[im 1/15]
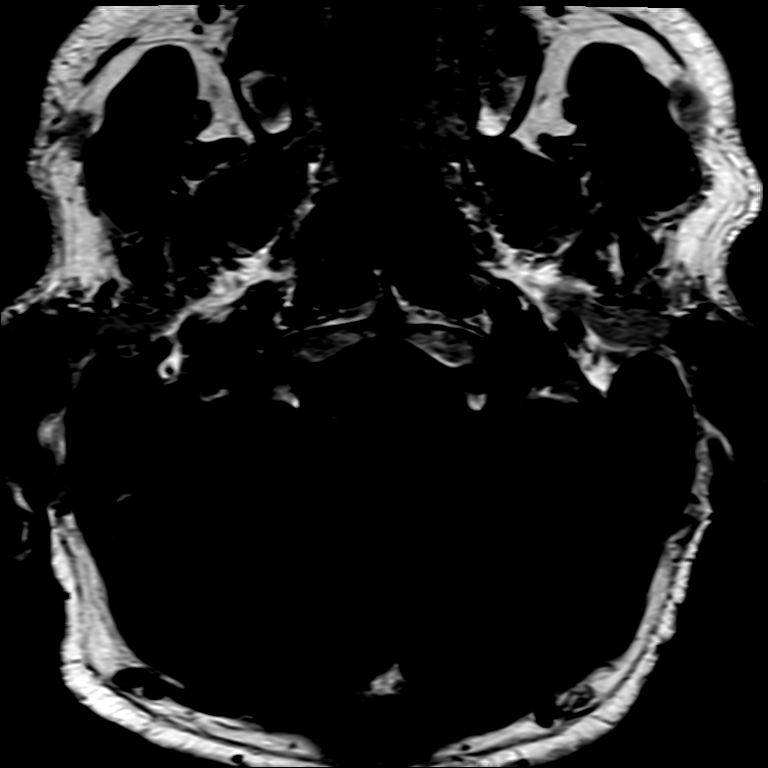
[im 15/15]
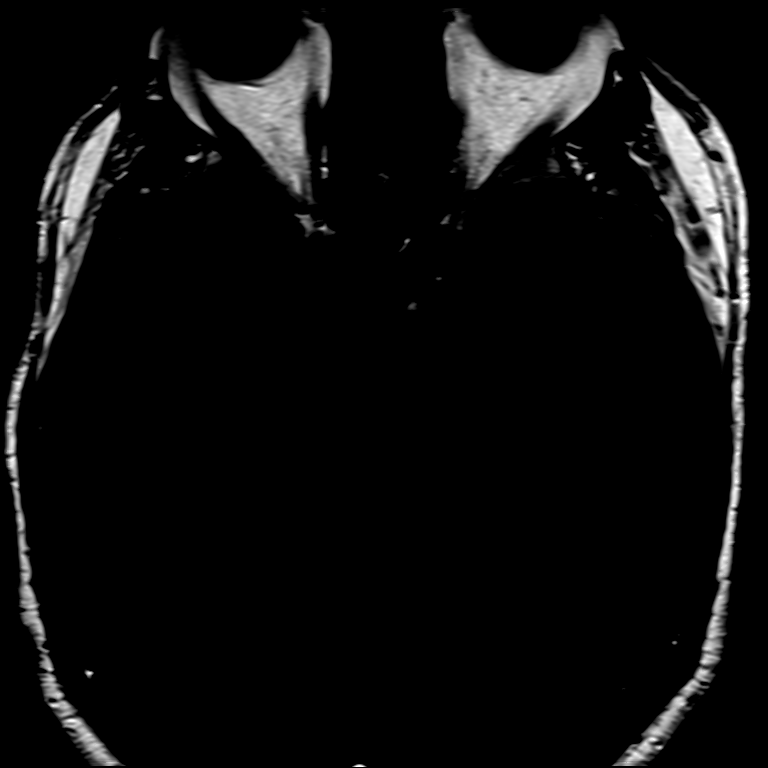

[Series 15: T1 post-contrast · axial · 3.0mm · 0.21mm/px · z∈[-50,-4]mm · 2 of 15 slices shown (1 of 3)]
[im 1/15]
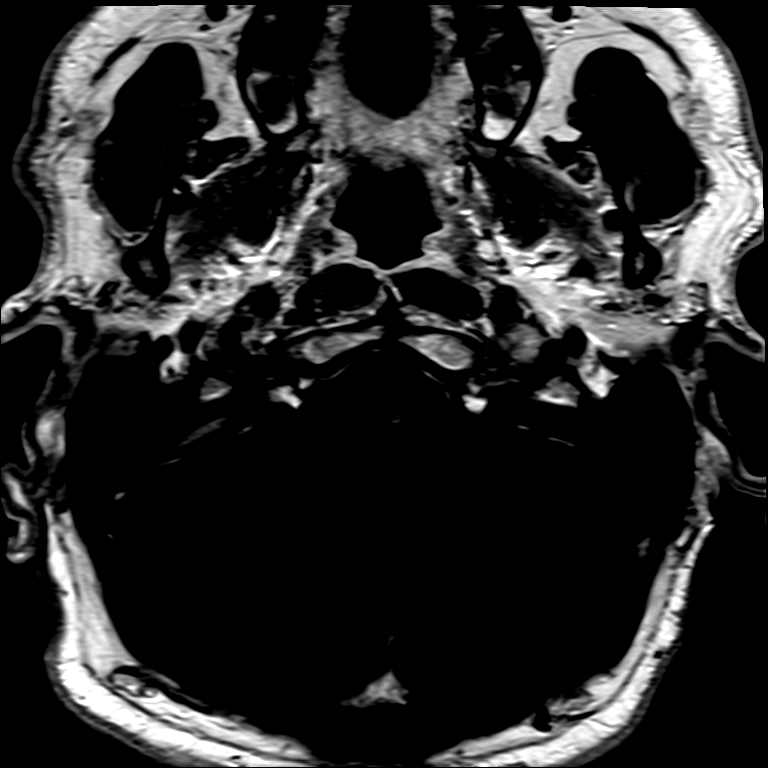
[im 15/15]
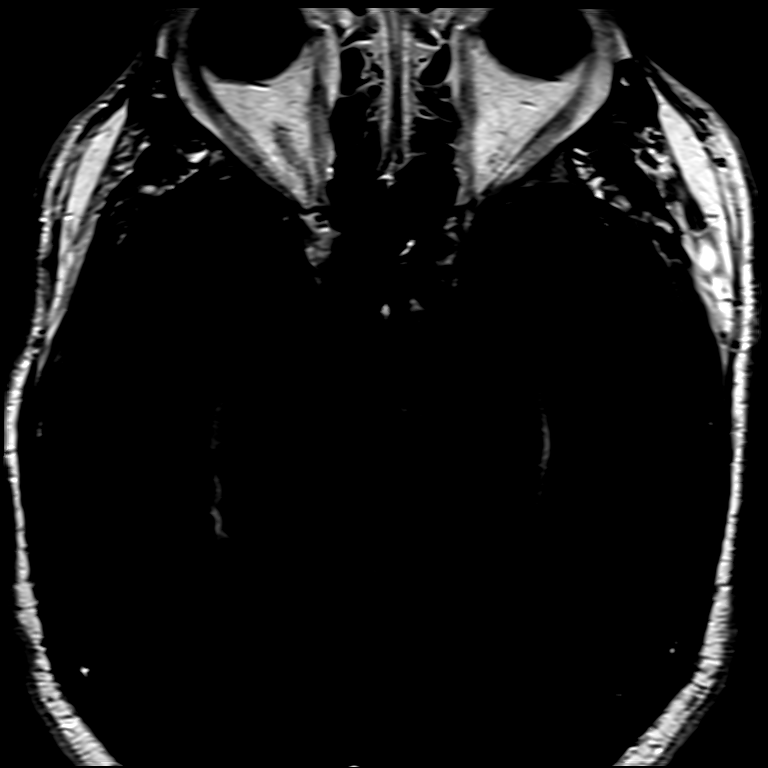

[Series 16: T1 post-contrast · coronal · 3.0mm · 0.21mm/px · 2 of 13 slices shown (2 of 3)]
[im 1/13]
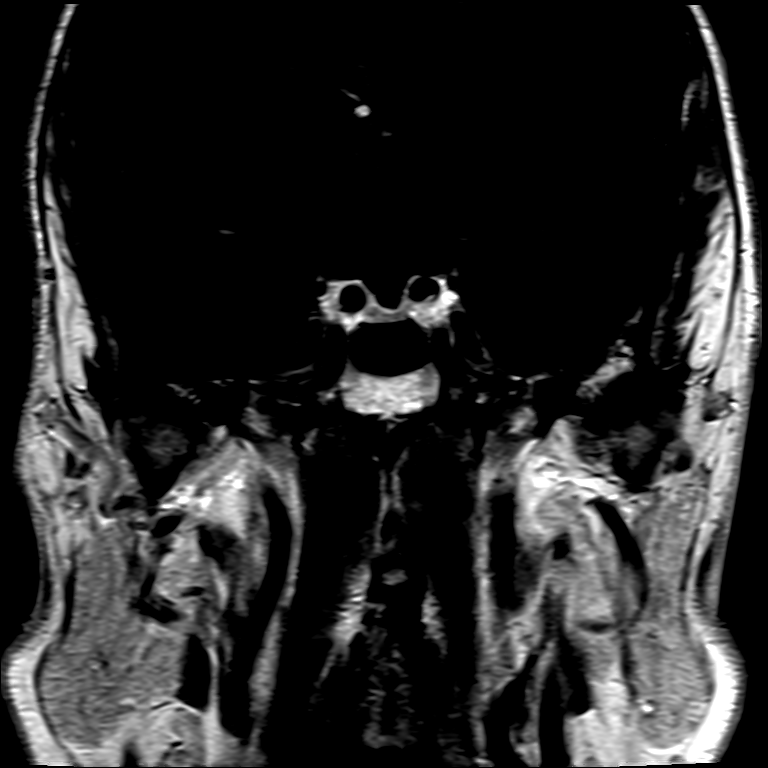
[im 13/13]
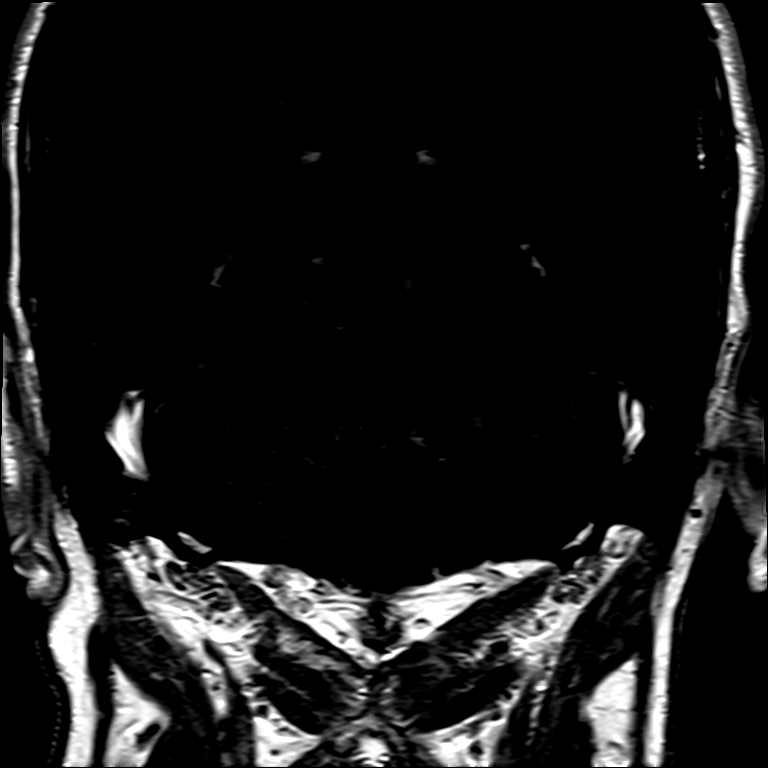

[Series 17: T1 post-contrast · axial · 5.0mm · 0.90mm/px · z∈[-45,+110]mm · 3 of 27 slices shown (3 of 3)]
[im 1/27]
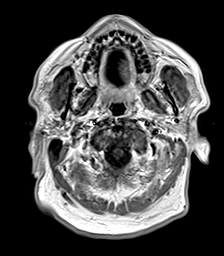
[im 14/27]
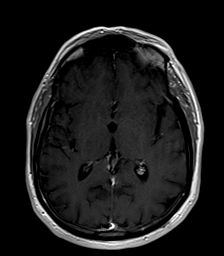
[im 27/27]
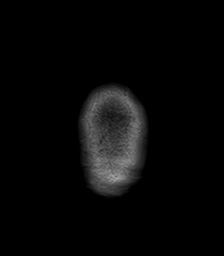

[21 of 48 positions shown; findings below may reference images not displayed]

FINDINGS: Brain: Symmetric normal labyrinthine signal. No mastoid or middle
ear opacification. Mild cerebellar atrophy.

Unremarkable supratentorial brain for age. No infarct, hemorrhage,
hydrocephalus, or mass.

At and below the left jugular foramen is a 10 x 12 mm fusiform
enhancing mass that displaces and partially effaces the internal
jugular vein posteriorly. Fairly homogeneous T2 signal with no
apparent internal flow voids.

Vascular: Lost flow void in the upper left jugular vein, which has
also diffusion hyperintense but symmetrically enhancing on
postcontrast imaging. Patient's tinnitus is non pulsatile.

Skull and upper cervical spine: Negative for marrow lesion. Left
cervical facet spurring where covered.

Sinuses/Orbits: Negative.  No mastoid or middle ear opacification
IMPRESSION: 1. 10 x 12 mm mass at and below the jugular foramen on the left,
location and characteristics favoring schwannoma over paraganglioma,
meningioma, or metastasis. Recommend imaging follow-up. CT of the
skull base may also be useful in evaluating for erosion versus
scalloping.
2. There is mass effect on the internal jugular vein which could be
a cause of tinnitus.

## 2020-02-15 NOTE — Progress Notes (Signed)
   Covid-19 Vaccination Clinic  Name:  Ruben Duncan    MRN: [id number] DOB: Feb 13, 1946  02/15/2020  [name] was observed post Covid-19 immunization for 15 minutes without incidence. He was provided with Vaccine Information Sheet and instruction to access the V-Safe system.   [name] was instructed to call 911 with any severe reactions post vaccine: Marland Kitchen Difficulty breathing  . Swelling of your face and throat  . A fast heartbeat  . A bad rash all over your body  . Dizziness and weakness    Immunizations Administered    Name Date Dose VIS Date Route   Pfizer COVID-19 Vaccine 02/15/2020 11:49 AM 0.3 mL 11/28/2019 Intramuscular   Manufacturer: [place]   Lot: [id number]   [place]: [id number]

## 2020-03-16 ENCOUNTER — Ambulatory Visit: Payer: Medicare HMO | Attending: Internal Medicine

## 2020-03-16 DIAGNOSIS — Z23 Encounter for immunization: Secondary | ICD-10-CM

## 2020-03-16 NOTE — Progress Notes (Signed)
   Covid-19 Vaccination Clinic  Name:  LEXTON HEARN    MRN: [id number] DOB: [birth year]  03/16/2020  Mr. Jozwiak was observed post Covid-19 immunization for 15 minutes without incident. He was provided with Vaccine Information Sheet and instruction to access the V-Safe system.   Mr. Amorin was instructed to call 911 with any severe reactions post vaccine: Marland Kitchen Difficulty breathing  . Swelling of face and throat  . A fast heartbeat  . A bad rash all over body  . Dizziness and weakness   Immunizations Administered    Name Date Dose VIS Date Route   Pfizer COVID-19 Vaccine 03/16/2020  9:41 AM 0.3 mL 11/28/2019 Intramuscular   Manufacturer: [place]   Lot: [id number]   [place]: [id number]
